# Patient Record
Sex: Male | Born: 1950 | Race: Black or African American | Hispanic: No | Marital: Single | Smoking: Former smoker
Health system: Southern US, Community
[De-identification: ages and names within clinical notes are randomized; demographics above are authoritative.]

## PROBLEM LIST (undated history)

## (undated) DIAGNOSIS — I10 Essential (primary) hypertension: Secondary | ICD-10-CM

## (undated) DIAGNOSIS — I499 Cardiac arrhythmia, unspecified: Secondary | ICD-10-CM

## (undated) DIAGNOSIS — K5904 Chronic idiopathic constipation: Secondary | ICD-10-CM

## (undated) DIAGNOSIS — K219 Gastro-esophageal reflux disease without esophagitis: Secondary | ICD-10-CM

## (undated) DIAGNOSIS — F419 Anxiety disorder, unspecified: Secondary | ICD-10-CM

## (undated) DIAGNOSIS — E782 Mixed hyperlipidemia: Secondary | ICD-10-CM

## (undated) DIAGNOSIS — I700 Atherosclerosis of aorta: Secondary | ICD-10-CM

## (undated) DIAGNOSIS — N183 Chronic kidney disease, stage 3 unspecified: Secondary | ICD-10-CM

## (undated) DIAGNOSIS — Z87442 Personal history of urinary calculi: Secondary | ICD-10-CM

## (undated) DIAGNOSIS — F411 Generalized anxiety disorder: Secondary | ICD-10-CM

## (undated) DIAGNOSIS — D3A020 Benign carcinoid tumor of the appendix: Secondary | ICD-10-CM

## (undated) DIAGNOSIS — N401 Enlarged prostate with lower urinary tract symptoms: Secondary | ICD-10-CM

## (undated) DIAGNOSIS — K449 Diaphragmatic hernia without obstruction or gangrene: Secondary | ICD-10-CM

## (undated) DIAGNOSIS — E7800 Pure hypercholesterolemia, unspecified: Secondary | ICD-10-CM

## (undated) DIAGNOSIS — Z8679 Personal history of other diseases of the circulatory system: Secondary | ICD-10-CM

## (undated) HISTORY — DX: Mixed hyperlipidemia: E78.2

## (undated) HISTORY — DX: Essential (primary) hypertension: I10

## (undated) HISTORY — DX: Anxiety disorder, unspecified: F41.9

## (undated) HISTORY — DX: Benign carcinoid tumor of the appendix: D3A.020

## (undated) HISTORY — DX: Gastro-esophageal reflux disease without esophagitis: K21.9

---

## 2001-10-25 ENCOUNTER — Encounter: Payer: Self-pay | Admitting: Emergency Medicine

## 2001-10-25 ENCOUNTER — Emergency Department (HOSPITAL_COMMUNITY): Admission: EM | Admit: 2001-10-25 | Discharge: 2001-10-25 | Payer: Self-pay | Admitting: Emergency Medicine

## 2001-10-25 ENCOUNTER — Ambulatory Visit (HOSPITAL_COMMUNITY): Admission: RE | Admit: 2001-10-25 | Discharge: 2001-10-25 | Payer: Self-pay | Admitting: Emergency Medicine

## 2002-06-10 ENCOUNTER — Ambulatory Visit (HOSPITAL_COMMUNITY): Admission: RE | Admit: 2002-06-10 | Discharge: 2002-06-10 | Payer: Self-pay | Admitting: Internal Medicine

## 2002-06-10 ENCOUNTER — Encounter: Payer: Self-pay | Admitting: Internal Medicine

## 2003-04-01 ENCOUNTER — Ambulatory Visit (HOSPITAL_COMMUNITY): Admission: RE | Admit: 2003-04-01 | Discharge: 2003-04-01 | Payer: Self-pay | Admitting: Internal Medicine

## 2003-09-08 ENCOUNTER — Ambulatory Visit (HOSPITAL_COMMUNITY): Admission: RE | Admit: 2003-09-08 | Discharge: 2003-09-08 | Payer: Self-pay | Admitting: Internal Medicine

## 2006-06-08 ENCOUNTER — Ambulatory Visit (HOSPITAL_COMMUNITY): Admission: RE | Admit: 2006-06-08 | Discharge: 2006-06-08 | Payer: Self-pay | Admitting: Internal Medicine

## 2008-07-21 ENCOUNTER — Ambulatory Visit (HOSPITAL_COMMUNITY): Admission: RE | Admit: 2008-07-21 | Discharge: 2008-07-21 | Payer: Self-pay | Admitting: Family Medicine

## 2008-07-30 ENCOUNTER — Encounter (INDEPENDENT_AMBULATORY_CARE_PROVIDER_SITE_OTHER): Payer: Self-pay | Admitting: *Deleted

## 2008-08-04 ENCOUNTER — Ambulatory Visit (HOSPITAL_COMMUNITY): Admission: RE | Admit: 2008-08-04 | Discharge: 2008-08-04 | Payer: Self-pay | Admitting: Family Medicine

## 2008-09-22 ENCOUNTER — Encounter (INDEPENDENT_AMBULATORY_CARE_PROVIDER_SITE_OTHER): Payer: Self-pay | Admitting: *Deleted

## 2009-05-22 DIAGNOSIS — I2510 Atherosclerotic heart disease of native coronary artery without angina pectoris: Secondary | ICD-10-CM

## 2009-05-22 HISTORY — DX: Atherosclerotic heart disease of native coronary artery without angina pectoris: I25.10

## 2009-05-22 HISTORY — PX: CARDIAC CATHETERIZATION: SHX172

## 2009-06-11 ENCOUNTER — Ambulatory Visit (HOSPITAL_COMMUNITY): Admission: RE | Admit: 2009-06-11 | Discharge: 2009-06-11 | Payer: Self-pay | Admitting: Family Medicine

## 2010-01-10 ENCOUNTER — Ambulatory Visit (HOSPITAL_COMMUNITY): Admission: RE | Admit: 2010-01-10 | Discharge: 2010-01-10 | Payer: Self-pay | Admitting: Cardiology

## 2010-01-14 ENCOUNTER — Ambulatory Visit (HOSPITAL_COMMUNITY): Admission: RE | Admit: 2010-01-14 | Discharge: 2010-01-15 | Payer: Self-pay | Admitting: Cardiology

## 2010-01-14 HISTORY — PX: CARDIAC CATHETERIZATION: SHX172

## 2010-06-11 ENCOUNTER — Encounter: Payer: Self-pay | Admitting: Internal Medicine

## 2010-08-05 LAB — CARDIAC PANEL(CRET KIN+CKTOT+MB+TROPI)
CK, MB: 2.2 ng/mL (ref 0.3–4.0)
Relative Index: 0.7 (ref 0.0–2.5)
Total CK: 294 U/L — ABNORMAL HIGH (ref 7–232)
Troponin I: 0.01 ng/mL (ref 0.00–0.06)

## 2010-08-05 LAB — BASIC METABOLIC PANEL
BUN: 10 mg/dL (ref 6–23)
BUN: 10 mg/dL (ref 6–23)
CO2: 26 mEq/L (ref 19–32)
CO2: 26 mEq/L (ref 19–32)
Calcium: 8.9 mg/dL (ref 8.4–10.5)
Calcium: 9.2 mg/dL (ref 8.4–10.5)
Chloride: 110 mEq/L (ref 96–112)
Chloride: 110 mEq/L (ref 96–112)
Creatinine, Ser: 0.95 mg/dL (ref 0.4–1.5)
Creatinine, Ser: 0.97 mg/dL (ref 0.4–1.5)
GFR calc Af Amer: >60 mL/min (ref >60)
GFR calc Af Amer: >60 mL/min (ref >60)
GFR calc non Af Amer: >60 mL/min (ref >60)
GFR calc non Af Amer: >60 mL/min (ref >60)
Glucose, Bld: 80 mg/dL (ref 70–99)
Glucose, Bld: 91 mg/dL (ref 70–99)
Potassium: 3.8 mEq/L (ref 3.5–5.1)
Potassium: 4.3 mEq/L (ref 3.5–5.1)
Sodium: 141 mEq/L (ref 135–145)
Sodium: 141 mEq/L (ref 135–145)

## 2010-08-05 LAB — TROPONIN I: Troponin I: 0.01 ng/mL (ref 0.00–0.06)

## 2010-08-05 LAB — CBC
HCT: 39.3 % (ref 39.0–52.0)
HCT: 40.5 % (ref 39.0–52.0)
Hemoglobin: 13 g/dL (ref 13.0–17.0)
Hemoglobin: 13.9 g/dL (ref 13.0–17.0)
MCH: 28.3 pg (ref 26.0–34.0)
MCH: 28.7 pg (ref 26.0–34.0)
MCHC: 33.1 g/dL (ref 30.0–36.0)
MCHC: 34.3 g/dL (ref 30.0–36.0)
MCV: 83.7 fL (ref 78.0–100.0)
MCV: 85.6 fL (ref 78.0–100.0)
Platelets: 149 10*3/uL — ABNORMAL LOW (ref 150–400)
Platelets: 149 10*3/uL — ABNORMAL LOW (ref 150–400)
RBC: 4.59 MIL/uL (ref 4.22–5.81)
RBC: 4.84 MIL/uL (ref 4.22–5.81)
RDW: 13.8 % (ref 11.5–15.5)
RDW: 14.2 % (ref 11.5–15.5)
WBC: 5.7 10*3/uL (ref 4.0–10.5)
WBC: 7.2 10*3/uL (ref 4.0–10.5)

## 2010-08-05 LAB — MRSA PCR SCREENING: MRSA by PCR: NEGATIVE

## 2011-09-19 ENCOUNTER — Other Ambulatory Visit (HOSPITAL_COMMUNITY): Payer: Self-pay | Admitting: Internal Medicine

## 2011-09-19 ENCOUNTER — Other Ambulatory Visit: Payer: Self-pay

## 2011-09-19 ENCOUNTER — Telehealth: Payer: Self-pay

## 2011-09-19 DIAGNOSIS — Z139 Encounter for screening, unspecified: Secondary | ICD-10-CM

## 2011-09-19 DIAGNOSIS — R51 Headache: Secondary | ICD-10-CM

## 2011-09-19 NOTE — Telephone Encounter (Signed)
OK to proceed with colonoscopy.

## 2011-09-19 NOTE — Telephone Encounter (Signed)
Gastroenterology Pre-Procedure Form    Request Date: 09/19/2011      Requesting Physician: Dr. Sherwood Gambler     PATIENT INFORMATION:  Leonard Stark is a 61 y.o., male (DOB=11-15-1950).  PROCEDURE: Procedure(s) requested: colonoscopy Procedure Reason: screening for colon cancer  PATIENT REVIEW QUESTIONS: The patient reports the following:   1. Diabetes Melitis: no 2. Joint replacements in the past 12 months: no 3. Major health problems in the past 3 months: no 4. Has an artificial valve or MVP:no 5. Has been advised in past to take antibiotics in advance of a procedure like teeth cleaning: no}    MEDICATIONS & ALLERGIES:    Patient reports the following regarding taking any blood thinners:   Plavix? no Aspirin?yes  Coumadin?  no  Patient confirms/reports the following medications:  Current Outpatient Prescriptions  Medication Sig Dispense Refill  . nebivolol (BYSTOLIC) 5 MG tablet Take 5 mg by mouth daily.      . pravastatin (PRAVACHOL) 10 MG tablet Take 20 mg by mouth daily.      . valsartan-hydrochlorothiazide (DIOVAN-HCT) 320-25 MG per tablet Take 1 tablet by mouth daily.      Marland Kitchen azithromycin (ZITHROMAX) 250 MG tablet Take 250 mg by mouth daily. Pt has not started yet  ( it was for his allergies/ sinus)      . ranitidine (ZANTAC) 150 MG capsule Take 150 mg by mouth 1 day or 1 dose. To take one daily prn  ( has not picked up yet)        Patient confirms/reports the following allergies:  No Known Allergies  Patient is appropriate to schedule for requested procedure(s): yes  AUTHORIZATION INFORMATION Primary Insurance:   ID #:  Group #:  Pre-Cert / Auth required Pre-Cert / Auth #:   Secondary Insurance:   ID #:   Group #:  Pre-Cert / Auth required: Pre-Cert / Auth #:   No orders of the defined types were placed in this encounter.    SCHEDULE INFORMATION: Procedure has been scheduled as follows:  Date: 10/13/2011        Time:  10:00 AM Location:[facility]  This Gastroenterology Pre-Precedure Form is being routed to the following provider(s) for review: R. Roetta Sessions, MD

## 2011-09-20 MED ORDER — PEG 3350-KCL-NA BICARB-NACL 420 G PO SOLR: ORAL | Status: AC

## 2011-09-20 NOTE — Telephone Encounter (Signed)
Rx and instructions mailed to pt.  

## 2011-09-22 ENCOUNTER — Other Ambulatory Visit (HOSPITAL_COMMUNITY): Payer: Self-pay

## 2011-10-12 MED ORDER — SODIUM CHLORIDE 0.45 % IV SOLN
Freq: Once | INTRAVENOUS | Status: AC
  Administered 2011-10-13: 1000 mL via INTRAVENOUS

## 2011-10-13 ENCOUNTER — Ambulatory Visit (HOSPITAL_COMMUNITY)
Admission: RE | Admit: 2011-10-13 | Discharge: 2011-10-13 | Disposition: A | Discharge status: Home / Self Care | Payer: Managed Care, Other (non HMO) | Source: Ambulatory Visit | Attending: Internal Medicine | Admitting: Internal Medicine

## 2011-10-13 ENCOUNTER — Encounter (HOSPITAL_COMMUNITY)
Admission: RE | Disposition: A | Discharge status: Home / Self Care | Payer: Self-pay | Source: Ambulatory Visit | Attending: Internal Medicine

## 2011-10-13 ENCOUNTER — Encounter (HOSPITAL_COMMUNITY): Payer: Self-pay | Admitting: *Deleted

## 2011-10-13 DIAGNOSIS — Z1211 Encounter for screening for malignant neoplasm of colon: Secondary | ICD-10-CM | POA: Insufficient documentation

## 2011-10-13 DIAGNOSIS — D128 Benign neoplasm of rectum: Secondary | ICD-10-CM | POA: Insufficient documentation

## 2011-10-13 DIAGNOSIS — Z139 Encounter for screening, unspecified: Secondary | ICD-10-CM

## 2011-10-13 DIAGNOSIS — K620 Anal polyp: Secondary | ICD-10-CM

## 2011-10-13 DIAGNOSIS — Z79899 Other long term (current) drug therapy: Secondary | ICD-10-CM | POA: Insufficient documentation

## 2011-10-13 DIAGNOSIS — I10 Essential (primary) hypertension: Secondary | ICD-10-CM | POA: Insufficient documentation

## 2011-10-13 DIAGNOSIS — K621 Rectal polyp: Secondary | ICD-10-CM

## 2011-10-13 HISTORY — DX: Essential (primary) hypertension: I10

## 2011-10-13 HISTORY — PX: COLONOSCOPY: SHX5424

## 2011-10-13 SURGERY — COLONOSCOPY
Anesthesia: Moderate Sedation

## 2011-10-13 MED ORDER — MIDAZOLAM HCL 5 MG/5ML IJ SOLN
INTRAMUSCULAR | Status: AC
  Filled 2011-10-13: qty 10

## 2011-10-13 MED ORDER — STERILE WATER FOR IRRIGATION IR SOLN
Status: DC | PRN
  Administered 2011-10-13: 10:00:00

## 2011-10-13 MED ORDER — MEPERIDINE HCL 100 MG/ML IJ SOLN
INTRAMUSCULAR | Status: AC
  Filled 2011-10-13: qty 1

## 2011-10-13 MED ORDER — MIDAZOLAM HCL 5 MG/5ML IJ SOLN
INTRAMUSCULAR | Status: DC | PRN
  Administered 2011-10-13: 2 mg via INTRAVENOUS
  Administered 2011-10-13 (×2): 1 mg via INTRAVENOUS

## 2011-10-13 MED ORDER — MEPERIDINE HCL 100 MG/ML IJ SOLN
INTRAMUSCULAR | Status: DC | PRN
  Administered 2011-10-13: 50 mg via INTRAVENOUS
  Administered 2011-10-13: 25 mg via INTRAVENOUS

## 2011-10-13 NOTE — H&P (Signed)
  Primary Care Physician:  Cassell Smiles., MD, MD Primary Gastroenterologist:  Dr. Jena Gauss  Pre-Procedure History & Physical: HPI:  Leonard Stark is a 61 y.o. male is here for a screening colonoscopy. No prior colonoscopy. No bowel symptoms. No family history of colon cancer or colon polyps.  Past Medical History  Diagnosis Date  . Hypertension     Past Surgical History  Procedure Date  . Cardiac catheterization 2011    Prior to Admission medications   Medication Sig Start Date End Date Taking? Authorizing Provider  azithromycin (ZITHROMAX) 250 MG tablet Take 250 mg by mouth daily. Pt has not started yet  ( it was for his allergies/ sinus)   Yes Historical Provider, MD  nebivolol (BYSTOLIC) 5 MG tablet Take 5 mg by mouth daily.   Yes Historical Provider, MD  pravastatin (PRAVACHOL) 10 MG tablet Take 20 mg by mouth daily.   Yes Historical Provider, MD  ranitidine (ZANTAC) 150 MG capsule Take 150 mg by mouth 1 day or 1 dose. To take one daily prn  ( has not picked up yet)   Yes Historical Provider, MD  valsartan-hydrochlorothiazide (DIOVAN-HCT) 320-25 MG per tablet Take 1 tablet by mouth daily.   Yes Historical Provider, MD    Allergies as of 09/19/2011  . (No Known Allergies)    History reviewed. No pertinent family history.  History   Social History  . Marital Status: Divorced    Spouse Name: N/A    Number of Children: N/A  . Years of Education: N/A   Occupational History  . Not on file.   Social History Main Topics  . Smoking status: Never Smoker   . Smokeless tobacco: Not on file  . Alcohol Use: 0.5 oz/week    1 drink(s) per week  . Drug Use: No  . Sexually Active: Yes   Other Topics Concern  . Not on file   Social History Narrative  . No narrative on file    Review of Systems: See HPI, otherwise negative ROS  Physical Exam: BP 127/77  Pulse 76  Temp(Src) 97 F (36.1 C) (Oral)  Resp 22  Ht 6\' 1"  (1.854 m)  Wt 210 lb (95.255 kg)  BMI 27.71 kg/m2   SpO2 100% General:   Alert,  Well-developed, well-nourished, pleasant and cooperative in NAD Head:  Normocephalic and atraumatic. Eyes:  Sclera clear, no icterus.   Conjunctiva pink. Ears:  Normal auditory acuity. Nose:  No deformity, discharge,  or lesions. Mouth:  No deformity or lesions, dentition normal. Neck:  Supple; no masses or thyromegaly. Lungs:  Clear throughout to auscultation.   No wheezes, crackles, or rhonchi. No acute distress. Heart:  Regular rate and rhythm; no murmurs, clicks, rubs,  or gallops. Abdomen:  Soft, nontender and nondistended. No masses, hepatosplenomegaly or hernias noted. Normal bowel sounds, without guarding, and without rebound.   Msk:  Symmetrical without gross deformities. Normal posture. Pulses:  Normal pulses noted. Extremities:  Without clubbing or edema. Neurologic:  Alert and  oriented x4;  grossly normal neurologically. Skin:  Intact without significant lesions or rashes. Cervical Nodes:  No significant cervical adenopathy. Psych:  Alert and cooperative. Normal mood and affect.    Impression:   61 year old gentleman here for his first ever average risk screening colonoscopy.  Risks, benefits, limitations, imponderables and alternatives regarding colonoscopy have been reviewed with the patient. Questions have been answered. All parties agreeable.

## 2011-10-13 NOTE — Op Note (Signed)
[facility] [address] [place], [place]  [address]  COLONOSCOPY PROCEDURE REPORT  PATIENT:  Leonard Stark, Leonard Stark  MR#:  [id number] BIRTHDATE:  1950-11-23, 60 yrs. old  GENDER:  male ENDOSCOPIST:  R. Roetta Sessions, MD FACP [facility] REF. BY:  Artis Delay, M.D. PROCEDURE DATE:  10/13/2011 PROCEDURE:  Colonoscopy with biopsy INDICATIONS:  First ever average risk screening colonoscopy  INFORMED CONSENT:  The risks, benefits, alternatives and imponderables including but not limited to bleeding, perforation as well as the possibility of a missed lesion have been reviewed. The potential for biopsy, lesion removal, etc. have also been discussed.  Questions have been answered.  All parties agreeable. Please see the history and physical in the medical record for more information.  MEDICATIONS:  Versed 4 mg IV and Demerol 75 mg IV in divided doses.  DESCRIPTION OF PROCEDURE:  After a digital rectal exam was performed, the EC-[id number] ([id number]) colonoscope was advanced from the anus through the rectum and colon to the area of the cecum, ileocecal valve and appendiceal orifice.  The cecum was deeply intubated.  These structures were well-seen and photographed for the record.  From the level of the cecum and ileocecal valve, the scope was slowly and cautiously withdrawn.  The mucosal surfaces were carefully surveyed utilizing scope tip deflection to facilitate fold flattening as needed.  The scope was pulled down into the rectum where a thorough examination including retroflexion was performed. <<PROCEDUREIMAGES>>  FINDINGS: Poor preparation with vegetable matter scattered throughout the entire colon which could not be done away with. This compromised the  examination. Normal rectum aside from 2 tiny diminutive polypoid areas at 10 cm in from the anal verge. Somewhat redundant but otherwise grossly normal colonic mucosa.  THERAPEUTIC / DIAGNOSTIC MANEUVERS PERFORMED:   The 2   diminutive polypoid areas in the rectum were biopsied/removed with cold biopsy forcep technique.  COMPLICATIONS:  None  CECAL WITHDRAWAL TIME: 12 minutes  IMPRESSION: 2 diminutive polyps in the rectum-removed as described above. Grossly normal colon-however poor prep compromised examination.  RECOMMENDATIONS:   Followup on pathology.  ______________________________ R. Roetta Sessions, MD Caleen Essex  CC:  Artis Delay, M.D.  n. eSIGNED:   R. Roetta Sessions at 10/13/2011 11:03 AM  Suzzanne Cloud, [phone]

## 2011-10-13 NOTE — Discharge Instructions (Addendum)
Colonoscopy Discharge Instructions  Read the instructions outlined below and refer to this sheet in the next few weeks. These discharge instructions provide you with general information on caring for yourself after you leave the hospital. Your doctor may also give you specific instructions. While your treatment has been planned according to the most current medical practices available, unavoidable complications occasionally occur. If you have any problems or questions after discharge, call Dr. Rourk at [phone]. ACTIVITY  You may resume your regular activity, but move at a slower pace for the next 24 hours.   Take frequent rest periods for the next 24 hours.   Walking will help get rid of the air and reduce the bloated feeling in your belly (abdomen).   No driving for 24 hours (because of the medicine (anesthesia) used during the test).    Do not sign any important legal documents or operate any machinery for 24 hours (because of the anesthesia used during the test).  NUTRITION  Drink plenty of fluids.   You may resume your normal diet as instructed by your doctor.   Begin with a light meal and progress to your normal diet. Heavy or fried foods are harder to digest and may make you feel sick to your stomach (nauseated).   Avoid alcoholic beverages for 24 hours or as instructed.  MEDICATIONS  You may resume your normal medications unless your doctor tells you otherwise.  WHAT YOU CAN EXPECT TODAY  Some feelings of bloating in the abdomen.   Passage of more gas than usual.   Spotting of blood in your stool or on the toilet paper.  IF YOU HAD POLYPS REMOVED DURING THE COLONOSCOPY:  No aspirin products for 7 days or as instructed.   No alcohol for 7 days or as instructed.   Eat a soft diet for the next 24 hours.  FINDING OUT THE RESULTS OF YOUR TEST Not all test results are available during your visit. If your test results are not back during the visit, make an appointment  with your caregiver to find out the results. Do not assume everything is normal if you have not heard from your caregiver or the medical facility. It is important for you to follow up on all of your test results.  SEEK IMMEDIATE MEDICAL ATTENTION IF:  You have more than a spotting of blood in your stool.   Your belly is swollen (abdominal distention).   You are nauseated or vomiting.   You have a temperature over 101.   You have abdominal pain or discomfort that is severe or gets worse throughout the day.    Polyp information provided.  Further recommendations to follow pending review of pathology report.  Colon Polyps A polyp is extra tissue that grows inside your body. Colon polyps grow in the large intestine. The large intestine, also called the colon, is part of your digestive system. It is a long, hollow tube at the end of your digestive tract where your body makes and stores stool. Most polyps are not dangerous. They are benign. This means they are not cancerous. But over time, some types of polyps can turn into cancer. Polyps that are smaller than a pea are usually not harmful. But larger polyps could someday become or may already be cancerous. To be safe, doctors remove all polyps and test them.  WHO GETS POLYPS? Anyone can get polyps, but certain people are more likely than others. You may have a greater chance of getting polyps if:    You are over 50.   You have had polyps before.   Someone in your family has had polyps.   Someone in your family has had cancer of the large intestine.   Find out if someone in your family has had polyps. You may also be more likely to get polyps if you:   Eat a lot of fatty foods.   Smoke.   Drink alcohol.   Do not exercise.   Eat too much.  SYMPTOMS  Most small polyps do not cause symptoms. People often do not know they have one until their caregiver finds it during a regular checkup or while testing them for something else. Some  people do have symptoms like these:  Bleeding from the anus. You might notice blood on your underwear or on toilet paper after you have had a bowel movement.   Constipation or diarrhea that lasts more than a week.   Blood in the stool. Blood can make stool look black or it can show up as red streaks in the stool.  If you have any of these symptoms, see your caregiver. HOW DOES THE DOCTOR TEST FOR POLYPS? The doctor can use four tests to check for polyps:  Digital rectal exam. The caregiver wears gloves and checks your rectum (the last part of the large intestine) to see if it feels normal. This test would find polyps only in the rectum. Your caregiver may need to do one of the other tests listed below to find polyps higher up in the intestine.   Barium enema. The caregiver puts a liquid called barium into your rectum before taking x-rays of your large intestine. Barium makes your intestine look white in the pictures. Polyps are dark, so they are easy to see.   Sigmoidoscopy. With this test, the caregiver can see inside your large intestine. A thin flexible tube is placed into your rectum. The device is called a sigmoidoscope, which has a light and a tiny video camera in it. The caregiver uses the sigmoidoscope to look at the last third of your large intestine.   Colonoscopy. This test is like sigmoidoscopy, but the caregiver looks at all of the large intestine. It usually requires sedation. This is the most common method for finding and removing polyps.  TREATMENT   The caregiver will remove the polyp during sigmoidoscopy or colonoscopy. The polyp is then tested for cancer.   If you have had polyps, your caregiver may want you to get tested regularly in the future.  PREVENTION  There is not one sure way to prevent polyps. You might be able to lower your risk of getting them if you:  Eat more fruits and vegetables and less fatty food.   Do not smoke.   Avoid alcohol.   Exercise every  day.   Lose weight if you are overweight.   Eating more calcium and folate can also lower your risk of getting polyps. Some foods that are rich in calcium are milk, cheese, and broccoli. Some foods that are rich in folate are chickpeas, kidney beans, and spinach.   Aspirin might help prevent polyps. Studies are under way.  Document Released: 02/02/2004 Document Revised: 04/27/2011 Document Reviewed: 07/10/2007 [employer] Patient Information 2012 [employer]. 

## 2011-10-18 ENCOUNTER — Encounter (HOSPITAL_COMMUNITY): Payer: Self-pay | Admitting: Internal Medicine

## 2011-10-21 ENCOUNTER — Encounter: Payer: Self-pay | Admitting: Internal Medicine

## 2012-07-09 ENCOUNTER — Ambulatory Visit (HOSPITAL_COMMUNITY)
Admission: RE | Admit: 2012-07-09 | Discharge: 2012-07-09 | Disposition: A | Discharge status: Home / Self Care | Payer: Managed Care, Other (non HMO) | Source: Ambulatory Visit | Attending: Family Medicine | Admitting: Family Medicine

## 2012-07-09 ENCOUNTER — Emergency Department (HOSPITAL_COMMUNITY): Payer: Managed Care, Other (non HMO)

## 2012-07-09 ENCOUNTER — Encounter (HOSPITAL_COMMUNITY): Payer: Self-pay

## 2012-07-09 ENCOUNTER — Other Ambulatory Visit: Payer: Self-pay

## 2012-07-09 ENCOUNTER — Other Ambulatory Visit (HOSPITAL_COMMUNITY): Payer: Self-pay | Admitting: Family Medicine

## 2012-07-09 ENCOUNTER — Inpatient Hospital Stay (HOSPITAL_COMMUNITY)
Admission: EM | Admit: 2012-07-09 | Discharge: 2012-07-12 | DRG: 373 | Disposition: A | Discharge status: Home / Self Care | Payer: Managed Care, Other (non HMO) | Attending: General Surgery | Admitting: General Surgery

## 2012-07-09 DIAGNOSIS — R1011 Right upper quadrant pain: Secondary | ICD-10-CM

## 2012-07-09 DIAGNOSIS — E7800 Pure hypercholesterolemia, unspecified: Secondary | ICD-10-CM | POA: Diagnosis present

## 2012-07-09 DIAGNOSIS — K3580 Unspecified acute appendicitis: Secondary | ICD-10-CM | POA: Insufficient documentation

## 2012-07-09 DIAGNOSIS — R933 Abnormal findings on diagnostic imaging of other parts of digestive tract: Secondary | ICD-10-CM | POA: Insufficient documentation

## 2012-07-09 DIAGNOSIS — K352 Acute appendicitis with generalized peritonitis, without abscess: Principal | ICD-10-CM | POA: Diagnosis present

## 2012-07-09 DIAGNOSIS — I10 Essential (primary) hypertension: Secondary | ICD-10-CM | POA: Diagnosis present

## 2012-07-09 DIAGNOSIS — Z7982 Long term (current) use of aspirin: Secondary | ICD-10-CM

## 2012-07-09 DIAGNOSIS — R109 Unspecified abdominal pain: Secondary | ICD-10-CM | POA: Insufficient documentation

## 2012-07-09 DIAGNOSIS — K3532 Acute appendicitis with perforation and localized peritonitis, without abscess: Secondary | ICD-10-CM

## 2012-07-09 DIAGNOSIS — K35209 Acute appendicitis with generalized peritonitis, without abscess, unspecified as to perforation: Principal | ICD-10-CM | POA: Diagnosis present

## 2012-07-09 HISTORY — DX: Pure hypercholesterolemia, unspecified: E78.00

## 2012-07-09 LAB — CBC WITH DIFFERENTIAL/PLATELET
Eosinophils Absolute: 0 10*3/uL (ref 0.0–0.7)
Eosinophils Relative: 0 % (ref 0–5)
HCT: 42.2 % (ref 39.0–52.0)
Hemoglobin: 14.2 g/dL (ref 13.0–17.0)
Lymphs Abs: 1.8 10*3/uL (ref 0.7–4.0)
MCH: 28.3 pg (ref 26.0–34.0)
MCV: 84.1 fL (ref 78.0–100.0)
Monocytes Absolute: 1.2 10*3/uL — ABNORMAL HIGH (ref 0.1–1.0)
Monocytes Relative: 13 % — ABNORMAL HIGH (ref 3–12)
Neutrophils Relative %: 69 % (ref 43–77)
RBC: 5.02 MIL/uL (ref 4.22–5.81)

## 2012-07-09 LAB — URINE MICROSCOPIC-ADD ON

## 2012-07-09 LAB — COMPREHENSIVE METABOLIC PANEL
ALT: 31 U/L (ref 0–53)
AST: 24 U/L (ref 0–37)
BUN: 14 mg/dL (ref 6–23)
GFR calc non Af Amer: 64 mL/min — ABNORMAL LOW (ref >90)
Glucose, Bld: 93 mg/dL (ref 70–99)
Potassium: 3.2 mEq/L — ABNORMAL LOW (ref 3.5–5.1)
Total Bilirubin: 1.7 mg/dL — ABNORMAL HIGH (ref 0.3–1.2)
Total Protein: 8.4 g/dL — ABNORMAL HIGH (ref 6.0–8.3)

## 2012-07-09 LAB — URINALYSIS, ROUTINE W REFLEX MICROSCOPIC
Leukocytes, UA: NEGATIVE
Nitrite: NEGATIVE
Protein, ur: NEGATIVE mg/dL
Specific Gravity, Urine: <1.005 — ABNORMAL LOW (ref 1.005–1.030)
Urobilinogen, UA: 2 mg/dL — ABNORMAL HIGH (ref 0.0–1.0)

## 2012-07-09 LAB — TROPONIN I: Troponin I: <0.3 ng/mL (ref <0.30)

## 2012-07-09 MED ORDER — HYDROMORPHONE HCL PF 1 MG/ML IJ SOLN
1.0000 mg | INTRAMUSCULAR | Status: DC | PRN
  Administered 2012-07-09: 2 mg via INTRAVENOUS
  Filled 2012-07-09: qty 2

## 2012-07-09 MED ORDER — MORPHINE SULFATE 4 MG/ML IJ SOLN
4.0000 mg | INTRAMUSCULAR | Status: DC | PRN
  Administered 2012-07-09: 4 mg via INTRAVENOUS
  Filled 2012-07-09: qty 1

## 2012-07-09 MED ORDER — ONDANSETRON HCL 4 MG/2ML IJ SOLN
4.0000 mg | INTRAMUSCULAR | Status: DC | PRN
  Administered 2012-07-09: 4 mg via INTRAVENOUS
  Filled 2012-07-09: qty 2

## 2012-07-09 MED ORDER — ONDANSETRON HCL 4 MG/2ML IJ SOLN
4.0000 mg | Freq: Four times a day (QID) | INTRAMUSCULAR | Status: DC | PRN
  Administered 2012-07-10: 4 mg via INTRAVENOUS
  Filled 2012-07-09: qty 2

## 2012-07-09 MED ORDER — ERTAPENEM SODIUM 1 G IJ SOLR
1.0000 g | INTRAMUSCULAR | Status: DC
  Administered 2012-07-10 – 2012-07-11 (×2): 1 g via INTRAVENOUS
  Filled 2012-07-09 (×6): qty 1

## 2012-07-09 MED ORDER — SODIUM CHLORIDE 0.9 % IV SOLN
INTRAVENOUS | Status: DC
  Administered 2012-07-09 – 2012-07-12 (×3): via INTRAVENOUS

## 2012-07-09 MED ORDER — LACTATED RINGERS IV SOLN
INTRAVENOUS | Status: DC
  Administered 2012-07-09 – 2012-07-11 (×4): via INTRAVENOUS

## 2012-07-09 MED ORDER — IOHEXOL 300 MG/ML  SOLN
100.0000 mL | Freq: Once | INTRAMUSCULAR | Status: AC | PRN
  Administered 2012-07-09: 100 mL via INTRAVENOUS

## 2012-07-09 MED ORDER — SODIUM CHLORIDE 0.9 % IV SOLN
500.0000 mg | Freq: Once | INTRAVENOUS | Status: AC
  Administered 2012-07-09: 500 mg via INTRAVENOUS
  Filled 2012-07-09: qty 500

## 2012-07-09 MED ORDER — ENOXAPARIN SODIUM 40 MG/0.4ML ~~LOC~~ SOLN
40.0000 mg | SUBCUTANEOUS | Status: DC
  Administered 2012-07-09: 40 mg via SUBCUTANEOUS
  Filled 2012-07-09: qty 0.4

## 2012-07-09 MED ORDER — ENOXAPARIN SODIUM 40 MG/0.4ML ~~LOC~~ SOLN
40.0000 mg | SUBCUTANEOUS | Status: DC
  Administered 2012-07-10 – 2012-07-11 (×2): 40 mg via SUBCUTANEOUS
  Filled 2012-07-09 (×3): qty 0.4

## 2012-07-09 MED ORDER — SODIUM CHLORIDE 0.9 % IV SOLN: 1.0000 g | INTRAVENOUS | Status: DC

## 2012-07-09 MED ORDER — PANTOPRAZOLE SODIUM 40 MG IV SOLR
40.0000 mg | Freq: Every day | INTRAVENOUS | Status: DC
  Administered 2012-07-09 – 2012-07-11 (×3): 40 mg via INTRAVENOUS
  Filled 2012-07-09 (×3): qty 40

## 2012-07-09 NOTE — ED Notes (Signed)
Pt reports lower abd pain since SUnday.  LBM was Monday.  Denies any n/v.  Went to [place] and saw PA and had CT scan today.  Reports was diagnosed with appendicitis and sent here for further treatment.

## 2012-07-09 NOTE — ED Provider Notes (Signed)
History     CSN: [id number]  Arrival date & time 07/09/12  1421   First MD Initiated Contact with Patient 07/09/12 1424      Chief Complaint  Patient presents with  . Abdominal Pain     HPI Pt was seen at 1430.    Per pt, c/o gradual onset and worsening of persistent generalized abd "pain" for the past 2 days.  Has been associated with home fevers to "101."  Pt was eval by his PMD today, sent for outpatient CT A/P, then sent to the ED for further eval.  Denies vomiting/diarrhea, no back pain, no rash, no CP/SOB, no black or blood in stools or emesis.        Past Medical History  Diagnosis Date  . Hypertension   . Hypercholesterolemia     Past Surgical History  Procedure Laterality Date  . Cardiac catheterization  2011  . Colonoscopy  10/13/2011    Procedure: COLONOSCOPY;  Surgeon: Corbin Ade, MD;  Location: [facility];  Service: Endoscopy;  Laterality: N/A;  10:00 AM    History  Substance Use Topics  . Smoking status: Never Smoker   . Smokeless tobacco: Not on file  . Alcohol Use: No     Review of Systems ROS: Statement: All systems negative except as marked or noted in the HPI; Constitutional: +fever and chills. ; ; Eyes: Negative for eye pain, redness and discharge. ; ; ENMT: Negative for ear pain, hoarseness, nasal congestion, sinus pressure and sore throat. ; ; Cardiovascular: Negative for chest pain, palpitations, diaphoresis, dyspnea and peripheral edema. ; ; Respiratory: Negative for cough, wheezing and stridor. ; ; Gastrointestinal: +abd pain. Negative for nausea, vomiting, diarrhea, blood in stool, hematemesis, jaundice and rectal bleeding. . ; ; Genitourinary: Negative for dysuria, flank pain and hematuria. ; ; Musculoskeletal: Negative for back pain and neck pain. Negative for swelling and trauma.; ; Skin: Negative for pruritus, rash, abrasions, blisters, bruising and skin lesion.; ; Neuro: Negative for headache, lightheadedness and neck stiffness. Negative  for weakness, altered level of consciousness , altered mental status, extremity weakness, paresthesias, involuntary movement, seizure and syncope.       Allergies  Review of patient's allergies indicates no known allergies.  Home Medications   Current Outpatient Rx  Name  Route  Sig  Dispense  Refill  . azithromycin (ZITHROMAX) 250 MG tablet   Oral   Take 250 mg by mouth daily. Pt has not started yet  ( it was for his allergies/ sinus)         . nebivolol (BYSTOLIC) 5 MG tablet   Oral   Take 5 mg by mouth daily.         . pravastatin (PRAVACHOL) 10 MG tablet   Oral   Take 20 mg by mouth daily.         . ranitidine (ZANTAC) 150 MG capsule   Oral   Take 150 mg by mouth 1 day or 1 dose. To take one daily prn  ( has not picked up yet)         . valsartan-hydrochlorothiazide (DIOVAN-HCT) 320-25 MG per tablet   Oral   Take 1 tablet by mouth daily.           BP 127/82  Pulse 95  Temp(Src) 100.4 F (38 C) (Oral)  Resp 18  Ht 6\' 1"  (1.854 m)  Wt 212 lb (96.163 kg)  BMI 27.98 kg/m2  SpO2 98%  Physical Exam 1435: Physical examination:  Nursing notes reviewed; Vital signs and O2 SAT reviewed;  Constitutional: Well developed, Well nourished, Well hydrated, In no acute distress; Head:  Normocephalic, atraumatic; Eyes: EOMI, PERRL, No scleral icterus; ENMT: Mouth and pharynx normal, Mucous membranes moist; Neck: Supple, Full range of motion, No lymphadenopathy; Cardiovascular: Regular rate and rhythm, with occasional ectopy, No gallop; Respiratory: Breath sounds clear & equal bilaterally, No rales, rhonchi, wheezes.  Speaking full sentences with ease, Normal respiratory effort/excursion; Chest: Nontender, Movement normal; Abdomen: Soft, +RLQ tenderness to palp. +rebound, +guarding, +Rovsing's. Nondistended, Decreased bowel sounds; Genitourinary: No CVA tenderness; Extremities: Pulses normal, No tenderness, No edema, No calf edema or asymmetry.; Neuro: AA&Ox3, Major CN grossly  intact.  Speech clear. Climbs on and off stretcher easily by himself. Gait steady. No gross focal motor or sensory deficits in extremities.; Skin: Color normal, Warm, Dry.   ED Course  Procedures   1500:   T/C to General Surgeon Dr. Leticia Penna, case discussed, including:  HPI, pertinent PM/SHx, VS/PE, dx testing, ED course and treatment:  Agreeable to admit, requests he will review the CT scan and come to the ED for eval; aware workup is in progress, abx ordered, kept NPO.   1600:  T/C back from Dr. Leticia Penna, he has viewed pt's CT scan, states pt has perforated, will need abx first, no emergent surgery tonight, he will put in orders to admit.    MDM  MDM Reviewed: nursing note and vitals Reviewed previous: CT scan Interpretation: x-ray, labs and ECG    Date: 07/09/2012  Rate: 98  Rhythm: normal sinus rhythm and premature atrial contractions (PAC)  QRS Axis: normal  Intervals: normal  ST/T Wave abnormalities: nonspecific ST/T changes  Conduction Disutrbances:none  Narrative Interpretation:   Old EKG Reviewed: none available  Ct Abdomen Pelvis W Contrast 07/09/2012  *RADIOLOGY REPORT*  Clinical Data: Abdominal pain  CT ABDOMEN AND PELVIS WITH CONTRAST  Technique:  Multidetector CT imaging of the abdomen and pelvis was performed following the standard protocol during bolus administration of intravenous contrast.  Contrast: OMNIPAQUE IOHEXOL 300 MG/ML  SOLN  Comparison: 01/14/2010  Findings: Acute appendicitis.  The appendix is markedly inflamed with wall thickening.  There is extensive stranding in the surrounding fat.  At the tip of the appendix, there is  air fluid contained sac either representing a dilated appendix or appendiceal rupture with small abscess formation.  This collection is only 13 mm in diameter.  Diffuse hepatic steatosis.  Normal gallbladder per  Spleen, pancreas, adrenal glands are within normal limits.  Stable appearance of the kidneys.  Sub centimeter left interpolar  region angiomyolipoma is stable.  Mild atherosclerotic vascular calcifications.  No free fluid.  No abnormal adenopathy.  Bladder is unremarkable.  Unremarkable prostate.  Seminal vesicles are prominent of unknown significance.  No destructive bone lesion.  IMPRESSION: Acute appendicitis.  Ruptured appendicitis is suspected. Critical Value/emergent results were called by telephone at the time of interpretation on 07/09/2012 at 1345 hours to [facility] PA, who verbally acknowledged these results.   Original Report Authenticated By: Jolaine Click, M.D.    Dg Chest Port 1 View 07/09/2012  *RADIOLOGY REPORT*  Clinical Data: Cough, rule out infiltrate  PORTABLE CHEST - 1 VIEW  Comparison: 01/10/2010  Findings: Cardiomediastinal silhouette is stable. No pulmonary edema.  Linear atelectasis or early infiltrate left base.  The bony thorax is stable.  IMPRESSION: Linear atelectasis or early infiltrate left base.  No pulmonary edema.   Original Report Authenticated By: Natasha Mead, M.D.  Results for orders placed during the hospital encounter of 07/09/12  CBC WITH DIFFERENTIAL      Result Value Range   WBC 9.8  4.0 - 10.5 K/uL   RBC 5.02  4.22 - 5.81 MIL/uL   Hemoglobin 14.2  13.0 - 17.0 g/dL   HCT 16.1  09.6 - 04.5 %   MCV 84.1  78.0 - 100.0 fL   MCH 28.3  26.0 - 34.0 pg   MCHC 33.6  30.0 - 36.0 g/dL   RDW 40.9  81.1 - 91.4 %   Platelets 192  150 - 400 K/uL   Neutrophils Relative 69  43 - 77 %   Neutro Abs 6.7  1.7 - 7.7 K/uL   Lymphocytes Relative 18  12 - 46 %   Lymphs Abs 1.8  0.7 - 4.0 K/uL   Monocytes Relative 13 (*) 3 - 12 %   Monocytes Absolute 1.2 (*) 0.1 - 1.0 K/uL   Eosinophils Relative 0  0 - 5 %   Eosinophils Absolute 0.0  0.0 - 0.7 K/uL   Basophils Relative 0  0 - 1 %   Basophils Absolute 0.0  0.0 - 0.1 K/uL  COMPREHENSIVE METABOLIC PANEL      Result Value Range   Sodium 133 (*) 135 - 145 mEq/L   Potassium 3.2 (*) 3.5 - 5.1 mEq/L   Chloride 93 (*) 96 - 112 mEq/L   CO2 26  19  - 32 mEq/L   Glucose, Bld 93  70 - 99 mg/dL   BUN 14  6 - 23 mg/dL   Creatinine, Ser 7.82  0.50 - 1.35 mg/dL   Calcium 9.7  8.4 - 95.6 mg/dL   Total Protein 8.4 (*) 6.0 - 8.3 g/dL   Albumin 4.5  3.5 - 5.2 g/dL   AST 24  0 - 37 U/L   ALT 31  0 - 53 U/L   Alkaline Phosphatase 60  39 - 117 U/L   Total Bilirubin 1.7 (*) 0.3 - 1.2 mg/dL   GFR calc non Af Amer 64 (*) >90 mL/min   GFR calc Af Amer 74 (*) >90 mL/min  LIPASE, BLOOD      Result Value Range   Lipase 26  11 - 59 U/L  TROPONIN I      Result Value Range   Troponin I <0.30  <0.30 ng/mL          Laray Anger, DO 07/11/12 1337

## 2012-07-10 LAB — CBC
HCT: 36.7 % — ABNORMAL LOW (ref 39.0–52.0)
Hemoglobin: 12.4 g/dL — ABNORMAL LOW (ref 13.0–17.0)
MCV: 84.2 fL (ref 78.0–100.0)
Platelets: 174 10*3/uL (ref 150–400)
RBC: 4.36 MIL/uL (ref 4.22–5.81)
WBC: 11.5 10*3/uL — ABNORMAL HIGH (ref 4.0–10.5)

## 2012-07-10 LAB — BASIC METABOLIC PANEL
CO2: 25 mEq/L (ref 19–32)
Chloride: 98 mEq/L (ref 96–112)
Potassium: 3.1 mEq/L — ABNORMAL LOW (ref 3.5–5.1)
Sodium: 136 mEq/L (ref 135–145)

## 2012-07-10 LAB — URINE CULTURE

## 2012-07-10 MED ORDER — ACETAMINOPHEN 325 MG PO TABS
650.0000 mg | ORAL_TABLET | ORAL | Status: DC | PRN
  Administered 2012-07-10 – 2012-07-11 (×3): 650 mg via ORAL
  Filled 2012-07-10 (×3): qty 2

## 2012-07-10 MED ORDER — PHENOL 1.4 % MT LIQD
1.0000 | OROMUCOSAL | Status: DC | PRN
  Administered 2012-07-10: 1 via OROMUCOSAL
  Filled 2012-07-10: qty 177

## 2012-07-10 MED ORDER — POTASSIUM CHLORIDE 10 MEQ/100ML IV SOLN
10.0000 meq | INTRAVENOUS | Status: AC
  Administered 2012-07-10 (×4): 10 meq via INTRAVENOUS
  Filled 2012-07-10: qty 400

## 2012-07-10 NOTE — H&P (Signed)
Leonard Stark is an 62 y.o. male.   Chief Complaint: Right lower quadrant abdominal pain. HPI: Patient presented to [facility] with right lower quadrant abdominal pain. This had been ongoing since Sunday. Pain onset was revealed Lahey acute on Sunday. It persisted on Monday patient was limited in his activities. He was actually seen by his PCP who ordered an outpatient workup for his abdominal pain including a CT evaluation of the abdomen and pelvis. The findings on the CT were discussed the patient and patient was sent from the radiology department to the emergency room with suspected appendicitis. He has had some associated nausea vomiting. He has had associated fevers and chills. No change in bowel movements. No melena or hematochezia. No sick contacts. No similar symptomatology in the past.  Past Medical History  Diagnosis Date  . Hypertension   . Hypercholesterolemia     Past Surgical History  Procedure Laterality Date  . Cardiac catheterization  2011  . Colonoscopy  10/13/2011    Procedure: COLONOSCOPY;  Surgeon: Corbin Ade, MD;  Location: [facility];  Service: Endoscopy;  Laterality: N/A;  10:00 AM    No family history on file. Social History:  reports that he has never smoked. He does not have any smokeless tobacco history on file. He reports that he does not drink alcohol or use illicit drugs.  Allergies: No Known Allergies  Medications Prior to Admission  Medication Sig Dispense Refill  . aspirin EC 81 MG tablet Take 81 mg by mouth every morning.      Marland Kitchen atenolol (TENORMIN) 50 MG tablet Take 50 mg by mouth every morning.      . nebivolol (BYSTOLIC) 5 MG tablet Take 5 mg by mouth every morning.       . pravastatin (PRAVACHOL) 20 MG tablet Take 20 mg by mouth every morning.        Results for orders placed during the hospital encounter of 07/09/12 (from the past 48 hour(s))  CBC WITH DIFFERENTIAL     Status: Abnormal   Collection Time    07/09/12  2:45 PM   Result Value Range   WBC 9.8  4.0 - 10.5 K/uL   RBC 5.02  4.22 - 5.81 MIL/uL   Hemoglobin 14.2  13.0 - 17.0 g/dL   HCT 29.5  62.1 - 30.8 %   MCV 84.1  78.0 - 100.0 fL   MCH 28.3  26.0 - 34.0 pg   MCHC 33.6  30.0 - 36.0 g/dL   RDW 65.7  84.6 - 96.2 %   Platelets 192  150 - 400 K/uL   Neutrophils Relative 69  43 - 77 %   Neutro Abs 6.7  1.7 - 7.7 K/uL   Lymphocytes Relative 18  12 - 46 %   Lymphs Abs 1.8  0.7 - 4.0 K/uL   Monocytes Relative 13 (*) 3 - 12 %   Monocytes Absolute 1.2 (*) 0.1 - 1.0 K/uL   Eosinophils Relative 0  0 - 5 %   Eosinophils Absolute 0.0  0.0 - 0.7 K/uL   Basophils Relative 0  0 - 1 %   Basophils Absolute 0.0  0.0 - 0.1 K/uL  COMPREHENSIVE METABOLIC PANEL     Status: Abnormal   Collection Time    07/09/12  2:45 PM      Result Value Range   Sodium 133 (*) 135 - 145 mEq/L   Potassium 3.2 (*) 3.5 - 5.1 mEq/L   Chloride 93 (*)  96 - 112 mEq/L   CO2 26  19 - 32 mEq/L   Glucose, Bld 93  70 - 99 mg/dL   BUN 14  6 - 23 mg/dL   Creatinine, Ser 1.47  0.50 - 1.35 mg/dL   Calcium 9.7  8.4 - 82.9 mg/dL   Total Protein 8.4 (*) 6.0 - 8.3 g/dL   Albumin 4.5  3.5 - 5.2 g/dL   AST 24  0 - 37 U/L   ALT 31  0 - 53 U/L   Alkaline Phosphatase 60  39 - 117 U/L   Total Bilirubin 1.7 (*) 0.3 - 1.2 mg/dL   GFR calc non Af Amer 64 (*) >90 mL/min   GFR calc Af Amer 74 (*) >90 mL/min   Comment:            The eGFR has been calculated     using the CKD EPI equation.     This calculation has not been     validated in all clinical     situations.     eGFR's persistently     <90 mL/min signify     possible Chronic Kidney Disease.  LIPASE, BLOOD     Status: None   Collection Time    07/09/12  2:45 PM      Result Value Range   Lipase 26  11 - 59 U/L  TROPONIN I     Status: None   Collection Time    07/09/12  2:45 PM      Result Value Range   Troponin I <0.30  <0.30 ng/mL   Comment:            Due to the release kinetics of cTnI,     a negative result within the first  hours     of the onset of symptoms does not rule out     myocardial infarction with certainty.     If myocardial infarction is still suspected,     repeat the test at appropriate intervals.  URINALYSIS, ROUTINE W REFLEX MICROSCOPIC     Status: Abnormal   Collection Time    07/09/12  3:31 PM      Result Value Range   Color, Urine YELLOW  YELLOW   APPearance CLEAR  CLEAR   Specific Gravity, Urine <1.005 (*) 1.005 - 1.030   pH 6.0  5.0 - 8.0   Glucose, UA NEGATIVE  NEGATIVE mg/dL   Hgb urine dipstick MODERATE (*) NEGATIVE   Bilirubin Urine NEGATIVE  NEGATIVE   Ketones, ur NEGATIVE  NEGATIVE mg/dL   Protein, ur NEGATIVE  NEGATIVE mg/dL   Urobilinogen, UA 2.0 (*) 0.0 - 1.0 mg/dL   Nitrite NEGATIVE  NEGATIVE   Leukocytes, UA NEGATIVE  NEGATIVE  URINE MICROSCOPIC-ADD ON     Status: None   Collection Time    07/09/12  3:31 PM      Result Value Range   RBC / HPF 3-6  <3 RBC/hpf   Bacteria, UA RARE  RARE  BASIC METABOLIC PANEL     Status: Abnormal   Collection Time    07/10/12  4:55 AM      Result Value Range   Sodium 136  135 - 145 mEq/L   Potassium 3.1 (*) 3.5 - 5.1 mEq/L   Chloride 98  96 - 112 mEq/L   CO2 25  19 - 32 mEq/L   Glucose, Bld 127 (*) 70 - 99 mg/dL   BUN 20  6 - 23 mg/dL  Creatinine, Ser 1.32  0.50 - 1.35 mg/dL   Calcium 9.0  8.4 - 16.1 mg/dL   GFR calc non Af Amer 57 (*) >90 mL/min   GFR calc Af Amer 66 (*) >90 mL/min   Comment:            The eGFR has been calculated     using the CKD EPI equation.     This calculation has not been     validated in all clinical     situations.     eGFR's persistently     <90 mL/min signify     possible Chronic Kidney Disease.  CBC     Status: Abnormal   Collection Time    07/10/12  4:55 AM      Result Value Range   WBC 11.5 (*) 4.0 - 10.5 K/uL   RBC 4.36  4.22 - 5.81 MIL/uL   Hemoglobin 12.4 (*) 13.0 - 17.0 g/dL   HCT 09.6 (*) 04.5 - 40.9 %   MCV 84.2  78.0 - 100.0 fL   MCH 28.4  26.0 - 34.0 pg   MCHC 33.8  30.0 -  36.0 g/dL   RDW 81.1  91.4 - 78.2 %   Platelets 174  150 - 400 K/uL   Ct Abdomen Pelvis W Contrast  07/09/2012  *RADIOLOGY REPORT*  Clinical Data: Abdominal pain  CT ABDOMEN AND PELVIS WITH CONTRAST  Technique:  Multidetector CT imaging of the abdomen and pelvis was performed following the standard protocol during bolus administration of intravenous contrast.  Contrast: OMNIPAQUE IOHEXOL 300 MG/ML  SOLN  Comparison: 01/14/2010  Findings: Acute appendicitis.  The appendix is markedly inflamed with wall thickening.  There is extensive stranding in the surrounding fat.  At the tip of the appendix, there is  air fluid contained sac either representing a dilated appendix or appendiceal rupture with small abscess formation.  This collection is only 13 mm in diameter.  Diffuse hepatic steatosis.  Normal gallbladder per  Spleen, pancreas, adrenal glands are within normal limits.  Stable appearance of the kidneys.  Sub centimeter left interpolar region angiomyolipoma is stable.  Mild atherosclerotic vascular calcifications.  No free fluid.  No abnormal adenopathy.  Bladder is unremarkable.  Unremarkable prostate.  Seminal vesicles are prominent of unknown significance.  No destructive bone lesion.  IMPRESSION: Acute appendicitis.  Ruptured appendicitis is suspected. Critical Value/emergent results were called by telephone at the time of interpretation on 07/09/2012 at 1345 hours to [facility] PA, who verbally acknowledged these results.   Original Report Authenticated By: Jolaine Click, M.D.    Dg Chest Port 1 View  07/09/2012  *RADIOLOGY REPORT*  Clinical Data: Cough, rule out infiltrate  PORTABLE CHEST - 1 VIEW  Comparison: 01/10/2010  Findings: Cardiomediastinal silhouette is stable. No pulmonary edema.  Linear atelectasis or early infiltrate left base.  The bony thorax is stable.  IMPRESSION: Linear atelectasis or early infiltrate left base.  No pulmonary edema.   Original Report Authenticated By: Natasha Mead, M.D.     Review of Systems  Constitutional: Positive for fever, chills and malaise/fatigue.  HENT: Negative.   Eyes: Negative.   Respiratory: Negative.   Cardiovascular: Negative.   Gastrointestinal: Positive for heartburn, nausea, vomiting and abdominal pain (right lower quadrant). Negative for diarrhea, constipation, blood in stool and melena.  Genitourinary: Negative.   Musculoskeletal: Negative.   Skin: Negative.   Neurological: Negative.   Endo/Heme/Allergies: Negative.   Psychiatric/Behavioral: Negative.     Blood pressure  100/63, pulse 93, temperature 98.3 F (36.8 C), temperature source Oral, resp. rate 17, height 6\' 1"  (1.854 m), weight 94.1 kg (207 lb 7.3 oz), SpO2 93.00%. Physical Exam  Constitutional: He is oriented to person, place, and time. He appears well-developed and well-nourished. No distress.  HENT:  Head: Normocephalic and atraumatic.  Eyes: Conjunctivae and EOM are normal. Pupils are equal, round, and reactive to light. No scleral icterus.  Neck: Normal range of motion. Neck supple. No tracheal deviation present. No thyromegaly present.  Cardiovascular: Normal rate, regular rhythm, normal heart sounds and intact distal pulses.   Respiratory: Effort normal and breath sounds normal. No respiratory distress.  GI: Soft. He exhibits no distension and no mass. There is tenderness (moderate to severe right lower quadrant abdominal pain. No diffuse peritoneal signs.). There is guarding. There is no rebound.  Lymphadenopathy:    He has no cervical adenopathy.  Neurological: He is alert and oriented to person, place, and time.  Skin: Skin is warm and dry.     Assessment/Plan Perforated appendicitis with phlegmon. At this time a long discussion with the patient regarding the findings. It is evident patient has a phlegmon in the right lower quadrant consistent with perforation of his appendicitis. Clinically patient is stable at this time we'll continue patient on  IV antibiotics. Surgical indications were discussed with patient however this time patient will continue to be treated conservatively and monitor closely. Should his symptomatology worsen despite conservative management he will be taken to the operating room at that time. Should patient develop an abscess in the area of the phlegmon CT drainage may be appropriate however at this time there is no evidence for any drainable fluid collection. Patient will be continued on IV antibiotics. Continued on ice chips and sips for now. Continued on activity as tolerated. DVT prophylaxis be maintained.  Sadaf Przybysz C 07/10/2012, 1:08 PM

## 2012-07-10 NOTE — Care Management Note (Unsigned)
    Page 1 of 1   07/10/2012     11:10:34 AM   CARE MANAGEMENT NOTE 07/10/2012  Patient:  EUAL, LINDSTROM   Account Number:  [id number]  Date Initiated:  07/10/2012  Documentation initiated by:  Sharrie Rothman  Subjective/Objective Assessment:   Pt admitted from home with appendicitis with rupture. Pt lives alone and will return home at discharge. Pt is independent with ADL's.     Action/Plan:   No CM or HH needs noted.   Anticipated DC Date:  07/12/2012   Anticipated DC Plan:  HOME/SELF CARE      DC Planning Services  CM consult      Choice offered to / List presented to:             Status of service:  Completed, signed off Medicare Important Message given?   (If response is "NO", the following Medicare IM given date fields will be blank) Date Medicare IM given:   Date Additional Medicare IM given:    Discharge Disposition:    Per UR Regulation:    If discussed at Long Length of Stay Meetings, dates discussed:    Comments:  07/10/12 1110 Arlyss Queen, RN BSN CM

## 2012-07-10 NOTE — Progress Notes (Signed)
UR Chart Review Completed  

## 2012-07-11 NOTE — Progress Notes (Signed)
  Subjective: Pain better.  No fever chills.  No nausea.  NO change with BM.  +normal BM.  Objective: Vital signs in last 24 hours: Temp:  [98.3 F (36.8 C)-100.9 F (38.3 C)] 100.4 F (38 C) (02/20 1850) Pulse Rate:  [95-105] 98 (02/20 1850) Resp:  [18-20] 20 (02/20 1850) BP: (104-117)/(64-76) 104/64 mmHg (02/20 1850) SpO2:  [93 %-97 %] 94 % (02/20 1850) Last BM Date: 07/03/12  Intake/Output from previous day: 02/19 0701 - 02/20 0700 In: 960 [P.O.:960] Out: 350 [Urine:350] Intake/Output this shift:    General appearance: alert and no distress Resp: clear to auscultation bilaterally Cardio: regular rate and rhythm GI: +BS, soft, expected tenderness.  No difuse peritoneal signs.  Lab Results:   Recent Labs  07/09/12 1445 07/10/12 0455  WBC 9.8 11.5*  HGB 14.2 12.4*  HCT 42.2 36.7*  PLT 192 174   BMET  Recent Labs  07/09/12 1445 07/10/12 0455  NA 133* 136  K 3.2* 3.1*  CL 93* 98  CO2 26 25  GLUCOSE 93 127*  BUN 14 20  CREATININE 1.20 1.32  CALCIUM 9.7 9.0   PT/INR No results found for this basename: LABPROT, INR,  in the last 72 hours ABG No results found for this basename: PHART, PCO2, PO2, HCO3,  in the last 72 hours  Studies/Results: No results found.  Anti-infectives: Anti-infectives   Start     Dose/Rate Route Frequency Ordered Stop   07/09/12 1730  ertapenem (INVANZ) 1 g in sodium chloride 0.9 % 50 mL IVPB     1 g 100 mL/hr over 30 Minutes Intravenous Every 24 hours 07/09/12 1649     07/09/12 1615  ertapenem (INVANZ) 1 g in sodium chloride 0.9 % 50 mL IVPB  Status:  Discontinued     1 g 100 mL/hr over 30 Minutes Intravenous Every 24 hours 07/09/12 1602 07/09/12 1647   07/09/12 1445  imipenem-cilastatin (PRIMAXIN) 500 mg in sodium chloride 0.9 % 100 mL IVPB     500 mg 200 mL/hr over 30 Minutes Intravenous  Once 07/09/12 1443 07/09/12 1553      Assessment/Plan: s/p * No surgery found * Perferated appendicitis with phlegmon.  Continue  abx.  Advance diet as tolerated.  Responding appropriately to treatment.    LOS: 2 days    Daphene Chisholm C 07/11/2012

## 2012-07-12 LAB — CBC
HCT: 34.6 % — ABNORMAL LOW (ref 39.0–52.0)
MCV: 83.4 fL (ref 78.0–100.0)
RBC: 4.15 MIL/uL — ABNORMAL LOW (ref 4.22–5.81)
RDW: 13.7 % (ref 11.5–15.5)
WBC: 6.6 10*3/uL (ref 4.0–10.5)

## 2012-07-12 MED ORDER — SUCCINYLCHOLINE CHLORIDE 20 MG/ML IJ SOLN
INTRAMUSCULAR | Status: AC
  Filled 2012-07-12: qty 1

## 2012-07-12 MED ORDER — MIDAZOLAM HCL 2 MG/2ML IJ SOLN
INTRAMUSCULAR | Status: AC
  Filled 2012-07-12: qty 2

## 2012-07-12 MED ORDER — SODIUM CHLORIDE 0.9 % IJ SOLN
3.0000 mL | INTRAMUSCULAR | Status: DC | PRN
  Administered 2012-07-12: 3 mL via INTRAVENOUS

## 2012-07-12 MED ORDER — PROPOFOL 10 MG/ML IV EMUL
INTRAVENOUS | Status: AC
  Filled 2012-07-12: qty 20

## 2012-07-12 MED ORDER — AMOXICILLIN-POT CLAVULANATE 500-125 MG PO TABS: 1.0000 | ORAL_TABLET | Freq: Three times a day (TID) | ORAL | Status: DC

## 2012-07-12 MED ORDER — ROCURONIUM BROMIDE 50 MG/5ML IV SOLN
INTRAVENOUS | Status: AC
  Filled 2012-07-12: qty 1

## 2012-07-12 MED ORDER — HYDROCODONE-ACETAMINOPHEN 5-325 MG PO TABS: 1.0000 | ORAL_TABLET | ORAL | Status: DC | PRN

## 2012-07-12 MED ORDER — SUFENTANIL CITRATE 50 MCG/ML IV SOLN
INTRAVENOUS | Status: AC
  Filled 2012-07-12: qty 1

## 2012-07-12 MED ORDER — HYDROMORPHONE HCL PF 1 MG/ML IJ SOLN: 1.0000 mg | INTRAMUSCULAR | Status: DC | PRN

## 2012-07-12 MED ORDER — LIDOCAINE HCL (PF) 1 % IJ SOLN
INTRAMUSCULAR | Status: AC
  Filled 2012-07-12: qty 5

## 2012-07-12 NOTE — Progress Notes (Signed)
  Subjective: Tolerating full liquids. Pain is improved. No fevers or chills. Positive bowel function.  Objective: Vital signs in last 24 hours: Temp:  [98.3 F (36.8 C)-100.4 F (38 C)] 99.1 F (37.3 C) (02/21 0611) Pulse Rate:  [91-98] 91 (02/21 0611) Resp:  [18-20] 20 (02/21 0611) BP: (104-126)/(64-77) 124/77 mmHg (02/21 0611) SpO2:  [94 %-97 %] 96 % (02/21 0611) Last BM Date: 07/11/12  Intake/Output from previous day: 02/20 0701 - 02/21 0700 In: 960 [P.O.:960] Out: 200 [Urine:200] Intake/Output this shift: Total I/O In: 240 [P.O.:240] Out: 500 [Urine:500]  General appearance: alert and no distress GI: Soft, mild to moderate right lower quadrant tenderness. No peritoneal signs.  Lab Results:   Recent Labs  07/10/12 0455 07/12/12 0551  WBC 11.5* 6.6  HGB 12.4* 11.6*  HCT 36.7* 34.6*  PLT 174 153   BMET  Recent Labs  07/09/12 1445 07/10/12 0455  NA 133* 136  K 3.2* 3.1*  CL 93* 98  CO2 26 25  GLUCOSE 93 127*  BUN 14 20  CREATININE 1.20 1.32  CALCIUM 9.7 9.0   PT/INR No results found for this basename: LABPROT, INR,  in the last 72 hours ABG No results found for this basename: PHART, PCO2, PO2, HCO3,  in the last 72 hours  Studies/Results: No results found.  Anti-infectives: Anti-infectives   Start     Dose/Rate Route Frequency Ordered Stop   07/09/12 1730  ertapenem (INVANZ) 1 g in sodium chloride 0.9 % 50 mL IVPB     1 g 100 mL/hr over 30 Minutes Intravenous Every 24 hours 07/09/12 1649     07/09/12 1615  ertapenem (INVANZ) 1 g in sodium chloride 0.9 % 50 mL IVPB  Status:  Discontinued     1 g 100 mL/hr over 30 Minutes Intravenous Every 24 hours 07/09/12 1602 07/09/12 1647   07/09/12 1445  imipenem-cilastatin (PRIMAXIN) 500 mg in sodium chloride 0.9 % 100 mL IVPB     500 mg 200 mL/hr over 30 Minutes Intravenous  Once 07/09/12 1443 07/09/12 1553      Assessment/Plan: s/p * No surgery found * Perforated contained appendicitis with  associated phlegmon. Continue antibiotics. Continue to advance diet. Likely discharge later today depending patient's continued progress. We'll discharge on oral antibiotics.  LOS: 3 days    Tyrene Nader C 07/12/2012

## 2012-07-12 NOTE — Progress Notes (Signed)
Patient given discharge instructions with no questions. Asked Dr. Leticia Penna to call augmentin in to [facility] in [place] so patient had it for immediate use. Patient taken out of facility by staff in wheelchair. Left with wife.

## 2012-07-25 ENCOUNTER — Other Ambulatory Visit (HOSPITAL_COMMUNITY): Payer: Self-pay | Admitting: General Surgery

## 2012-07-25 DIAGNOSIS — R52 Pain, unspecified: Secondary | ICD-10-CM

## 2012-07-30 ENCOUNTER — Ambulatory Visit (HOSPITAL_COMMUNITY)
Admission: RE | Admit: 2012-07-30 | Discharge: 2012-07-30 | Disposition: A | Discharge status: Home / Self Care | Payer: Managed Care, Other (non HMO) | Source: Ambulatory Visit | Attending: General Surgery | Admitting: General Surgery

## 2012-07-30 DIAGNOSIS — K389 Disease of appendix, unspecified: Secondary | ICD-10-CM | POA: Insufficient documentation

## 2012-07-30 DIAGNOSIS — R1031 Right lower quadrant pain: Secondary | ICD-10-CM | POA: Insufficient documentation

## 2012-07-30 DIAGNOSIS — R52 Pain, unspecified: Secondary | ICD-10-CM

## 2012-07-30 MED ORDER — IOHEXOL 300 MG/ML  SOLN
100.0000 mL | Freq: Once | INTRAMUSCULAR | Status: AC | PRN
  Administered 2012-07-30: 100 mL via INTRAVENOUS

## 2012-08-12 NOTE — Discharge Summary (Signed)
Physician Discharge Summary  Patient ID: Leonard Stark MRN: [id number] DOB/AGE: [birth year] 62 y.o.  Admit date: 07/09/2012 Discharge date: 07/12/2012  Admission Diagnoses: Ruptured appendicitis with phlegmon  Discharge Diagnoses: The same Active Problems:   * No active hospital problems. *   Discharged Condition: stable  Hospital Course: Patient presented to [facility] with RLQ pain.  Work-up demonstrated contained perforation of the appendix.  Patient was admitted for antibiotic management.  Continued to demonstrate clinical improvement.  Patient was discharged on oral abx with plans for continued outpatient management and possible interval appendectomy.  Consults: None  Significant Diagnostic Studies: labs: daily  Treatments: antibiotics: Invanz  Discharge Exam: Blood pressure 124/77, pulse 91, temperature 99.1 F (37.3 C), temperature source Oral, resp. rate 20, height 6\' 1"  (1.854 m), weight 94.1 kg (207 lb 7.3 oz), SpO2 96.00%. General appearance: alert and no distress Resp: clear to auscultation bilaterally Cardio: regular rate and rhythm GI: +BS, soft, moderate RLQ pain.  No peritoneal signs.    Disposition: 01-Home or Self Care  Discharge Orders   Future Orders Complete By Expires     Diet - low sodium heart healthy  As directed     Discharge instructions  As directed     Comments:      Increase diet as tolerated.    Increase activity slowly  As directed         Medication List    TAKE these medications       amoxicillin-clavulanate 500-125 MG per tablet  Commonly known as:  AUGMENTIN  Take 1 tablet (500 mg total) by mouth 3 (three) times daily.     aspirin EC 81 MG tablet  Take 81 mg by mouth every morning.     atenolol 50 MG tablet  Commonly known as:  TENORMIN  Take 50 mg by mouth every morning.     HYDROcodone-acetaminophen 5-325 MG per tablet  Commonly known as:  NORCO/VICODIN  Take 1-2 tablets by mouth every 4 (four) hours as needed.     nebivolol 5  MG tablet  Commonly known as:  BYSTOLIC  Take 5 mg by mouth every morning.     pravastatin 20 MG tablet  Commonly known as:  PRAVACHOL  Take 20 mg by mouth every morning.           Follow-up Information   Follow up with Fabio Bering, MD On 07/25/2012. (2pm or sooner if symptoms worsen)    Contact information:   [address] [place] [address] [phone]       Signed: Fabio Bering 08/12/2012, 9:13 PM

## 2012-12-30 ENCOUNTER — Emergency Department (HOSPITAL_COMMUNITY): Payer: Managed Care, Other (non HMO)

## 2012-12-30 ENCOUNTER — Emergency Department (HOSPITAL_COMMUNITY)
Admission: EM | Admit: 2012-12-30 | Discharge: 2012-12-30 | Disposition: A | Discharge status: Home / Self Care | Payer: Managed Care, Other (non HMO) | Attending: Emergency Medicine | Admitting: Emergency Medicine

## 2012-12-30 ENCOUNTER — Encounter (HOSPITAL_COMMUNITY): Payer: Self-pay | Admitting: *Deleted

## 2012-12-30 DIAGNOSIS — R319 Hematuria, unspecified: Secondary | ICD-10-CM | POA: Insufficient documentation

## 2012-12-30 DIAGNOSIS — R110 Nausea: Secondary | ICD-10-CM | POA: Insufficient documentation

## 2012-12-30 DIAGNOSIS — E7800 Pure hypercholesterolemia, unspecified: Secondary | ICD-10-CM | POA: Insufficient documentation

## 2012-12-30 DIAGNOSIS — I10 Essential (primary) hypertension: Secondary | ICD-10-CM | POA: Insufficient documentation

## 2012-12-30 LAB — CBC WITH DIFFERENTIAL/PLATELET
Eosinophils Absolute: 0.6 10*3/uL (ref 0.0–0.7)
Eosinophils Relative: 9 % — ABNORMAL HIGH (ref 0–5)
Hemoglobin: 14 g/dL (ref 13.0–17.0)
Lymphs Abs: 2.6 10*3/uL (ref 0.7–4.0)
MCH: 28.7 pg (ref 26.0–34.0)
MCV: 83.8 fL (ref 78.0–100.0)
Monocytes Relative: 10 % (ref 3–12)
RBC: 4.88 MIL/uL (ref 4.22–5.81)

## 2012-12-30 LAB — COMPREHENSIVE METABOLIC PANEL
Alkaline Phosphatase: 57 U/L (ref 39–117)
BUN: 25 mg/dL — ABNORMAL HIGH (ref 6–23)
Calcium: 10 mg/dL (ref 8.4–10.5)
GFR calc Af Amer: 44 mL/min — ABNORMAL LOW (ref >90)
Glucose, Bld: 114 mg/dL — ABNORMAL HIGH (ref 70–99)
Total Protein: 7.8 g/dL (ref 6.0–8.3)

## 2012-12-30 LAB — URINE MICROSCOPIC-ADD ON

## 2012-12-30 LAB — URINALYSIS, ROUTINE W REFLEX MICROSCOPIC
Bilirubin Urine: NEGATIVE
Specific Gravity, Urine: >1.03 — ABNORMAL HIGH (ref 1.005–1.030)
pH: 6 (ref 5.0–8.0)

## 2012-12-30 MED ORDER — ONDANSETRON HCL 4 MG/2ML IJ SOLN
4.0000 mg | Freq: Once | INTRAMUSCULAR | Status: AC
  Administered 2012-12-30: 4 mg via INTRAVENOUS
  Filled 2012-12-30: qty 2

## 2012-12-30 MED ORDER — HYDROMORPHONE HCL PF 1 MG/ML IJ SOLN
1.0000 mg | Freq: Once | INTRAMUSCULAR | Status: AC
  Administered 2012-12-30: 1 mg via INTRAVENOUS
  Filled 2012-12-30: qty 1

## 2012-12-30 MED ORDER — HYDROCODONE-ACETAMINOPHEN 5-325 MG PO TABS: 1.0000 | ORAL_TABLET | Freq: Four times a day (QID) | ORAL | Status: DC | PRN

## 2012-12-30 MED ORDER — CEPHALEXIN 500 MG PO CAPS: 500.0000 mg | ORAL_CAPSULE | Freq: Four times a day (QID) | ORAL | Status: DC

## 2012-12-30 MED ORDER — ONDANSETRON 4 MG PO TBDP: ORAL_TABLET | ORAL | Status: DC

## 2012-12-30 NOTE — ED Notes (Signed)
C/o pain to right flank since yesterday with hematuria and nausea.  Denies burning with urination, denies vomiting.

## 2012-12-30 NOTE — ED Provider Notes (Signed)
CSN: [id number]     Arrival date & time 12/30/12  1809 History  This chart was scribed for Benny Lennert, MD, by Yevette Edwards, ED Scribe. This patient was seen in room APA01/[facility]01 and the patient's care was started at 6:26 PM.   First MD Initiated Contact with Patient 12/30/12 1819     Chief Complaint  Patient presents with  . Flank Pain    Patient is a 62 y.o. male presenting with flank pain and hematuria. The history is provided by the patient. No language interpreter was used.  Flank Pain This is a recurrent problem. The current episode started more than 2 days ago. Episode frequency: Intermittently. The problem has not changed since onset.Associated symptoms include abdominal pain. Nothing aggravates the symptoms. Nothing relieves the symptoms. He has tried nothing for the symptoms.  Hematuria Associated symptoms include abdominal pain.   HPI Comments:  Leonard Stark is a 62 y.o. male who presents to the Emergency Department complaining of intermittent right-sided flank pain which has been occurring for "a while," but which worsened yesterday. The pt reports that he has experienced hematuria and nausea in association with the flank pain. The flank pain intermittently radiates to his right abdomen. He denies experiencing any dysuria or emesis. The pt denies a h/o kidney calculi, but he reports he visited the ED several months ago for a ruptured appendix, though the pt though that it was associated with his kidneys. The pt has HTN and hypercholesterolemia.  He denies both smoking and using alcohol.   Past Medical History  Diagnosis Date  . Hypertension   . Hypercholesterolemia    Past Surgical History  Procedure Laterality Date  . Cardiac catheterization  2011  . Colonoscopy  10/13/2011    Procedure: COLONOSCOPY;  Surgeon: Corbin Ade, MD;  Location: [facility];  Service: Endoscopy;  Laterality: N/A;  10:00 AM   No family history on file. History  Substance Use Topics  .  Smoking status: Never Smoker   . Smokeless tobacco: Not on file  . Alcohol Use: No    Review of Systems  Gastrointestinal: Positive for nausea and abdominal pain. Negative for vomiting.  Genitourinary: Positive for hematuria and flank pain. Negative for dysuria.  All other systems reviewed and are negative.    Allergies  Review of patient's allergies indicates no known allergies.  Home Medications   Current Outpatient Rx  Name  Route  Sig  Dispense  Refill  . amoxicillin-clavulanate (AUGMENTIN) 500-125 MG per tablet   Oral   Take 1 tablet (500 mg total) by mouth 3 (three) times daily.   21 tablet   0   . aspirin EC 81 MG tablet   Oral   Take 81 mg by mouth every morning.         Marland Kitchen atenolol (TENORMIN) 50 MG tablet   Oral   Take 50 mg by mouth every morning.         Marland Kitchen HYDROcodone-acetaminophen (NORCO/VICODIN) 5-325 MG per tablet   Oral   Take 1-2 tablets by mouth every 4 (four) hours as needed.   45 tablet   0   . nebivolol (BYSTOLIC) 5 MG tablet   Oral   Take 5 mg by mouth every morning.          . pravastatin (PRAVACHOL) 20 MG tablet   Oral   Take 20 mg by mouth every morning.          Triage Vitals: BP 136/72  Pulse 69  Temp(Src) 97.9 F (36.6 C) (Oral)  Resp 20  Ht 6\' 1"  (1.854 m)  Wt 206 lb (93.441 kg)  BMI 27.18 kg/m2  SpO2 96%  Physical Exam  Nursing note and vitals reviewed. Constitutional: He is oriented to person, place, and time. He appears well-developed and well-nourished.  HENT:  Head: Normocephalic.  Eyes: Conjunctivae and EOM are normal. No scleral icterus.  Neck: Neck supple. No thyromegaly present.  Cardiovascular: Normal rate and regular rhythm.  Exam reveals no gallop and no friction rub.   No murmur heard. Pulmonary/Chest: Effort normal. No stridor. No respiratory distress. He has no wheezes. He has no rales. He exhibits no tenderness.  Abdominal: He exhibits no distension. There is no tenderness. There is no rebound.   Musculoskeletal: Normal range of motion. He exhibits no edema.  Moderate right flank tenderness.  Lymphadenopathy:    He has no cervical adenopathy.  Neurological: He is alert and oriented to person, place, and time. Coordination normal.  Skin: No rash noted. No erythema.  Psychiatric: He has a normal mood and affect. His behavior is normal.    ED Course   DIAGNOSTIC STUDIES:  Oxygen Saturation is 96% on room air, normal by my interpretation.    COORDINATION OF CARE:  6:30 PM- Discussed treatment plan with patient, and the patient agreed to the plan.   Procedures (including critical care time)  Labs Reviewed  URINALYSIS, ROUTINE W REFLEX MICROSCOPIC - Abnormal; Notable for the following:    Specific Gravity, Urine >1.030 (*)    Hgb urine dipstick MODERATE (*)    All other components within normal limits  CBC WITH DIFFERENTIAL - Abnormal; Notable for the following:    Neutrophils Relative % 38 (*)    Eosinophils Relative 9 (*)    All other components within normal limits  COMPREHENSIVE METABOLIC PANEL - Abnormal; Notable for the following:    Potassium 3.3 (*)    Glucose, Bld 114 (*)    BUN 25 (*)    Creatinine, Ser 1.86 (*)    GFR calc non Af Amer 38 (*)    GFR calc Af Amer 44 (*)    All other components within normal limits  URINE MICROSCOPIC-ADD ON - Abnormal; Notable for the following:    Casts HYALINE CASTS (*)    All other components within normal limits   Ct Abdomen Pelvis Wo Contrast  12/30/2012   *RADIOLOGY REPORT*  Clinical Data: Right-sided flank pain, hematuria, nausea history of ruptured appendix  CT ABDOMEN AND PELVIS WITHOUT CONTRAST  Technique:  Multidetector CT imaging of the abdomen and pelvis was performed following the standard protocol without intravenous contrast.  Comparison: 07/30/2012; 07/09/2012; 01/14/2010  Findings:  The lack of intravenous contrast limits the ability to evaluate solid abdominal organs.  Normal hepatic contour.  Normal  noncontrast appearance of the gallbladder given under distension.  No ascites.  There are multiple ill-defined punctate opacities within nearly all of the bilateral renal calyces which may represent scattered punctate (sub 4 mm) nonobstructing renal stones versus the sequela of medullary nephrocalcinosis.  No stones are seen along the expected course of either ureter or the urinary bladder.  Normal noncontrast appearance of the urinary bladder given degree distension. No urinary obstruction or perinephric stranding. Several phleboliths are noted within the lower pelvis, left greater than right.  Bilateral hypoattenuating previously characterized renal cysts are incompletely evaluated on this noncontrast examination though morphologically appear grossly unchanged.  Normal noncontrast appearance of the bilateral adrenal glands,  pancreas and spleen.  Moderate colonic stool burden without evidence of obstruction. Interval resolution of previously noted periappendiceal stranding. No pneumoperitoneum, pneumatosis or portal venous gas.  Scattered atherosclerotic plaque within a normal caliber abdominal aorta.  No definite retroperitoneal, mesenteric, pelvic or inguinal lymphadenopathy on this noncontrast examination.  Borderline enlargement of the prostate.  No free fluid within the pelvis.  Limited visualization of the lower thorax demonstrates a punctate (approximately 3 mm) nodule within the left lower lobe (image 4, series 3).  There is minimal bibasilar dependent subsegmental atelectasis.  No focal airspace opacity.  No pleural effusions.  Normal heart size.  No pericardial effusion.  No acute or aggressive osseous abnormalities.  IMPRESSION:  1.  Multiple punctate (<4 mm) poorly defined opacities are seen in nearly all of the bilateral renal calyces, similar to the 08/2003 examination, and while possibly representative of nonobstructing stones, medullary nephrocalcinosis may have a similar appearance. Clinical  correlation is advised.  2.  Interval apparent resolution of previously noted periappendiceal stranding on this noncontrast examination  3.  Punctate (approximately 3 mm nodule) within the left lower lobe, similar to the 07/2012 examination, although not definitely seen on prior more remote examinations.  If the patient is at high risk for bronchogenic carcinoma, follow-up chest CT at 1 year is recommended.  If the patient is at low risk, no follow-up is needed.  This recommendation follows the consensus statement: Guidelines for Management of Small Pulmonary Nodules Detected on CT Scans:  A Statement from the Fleischner Society as published in Radiology 2005; 237:395-400.   .   Original Report Authenticated By: Tacey Ruiz, MD   No diagnosis found.  MDM  Hematuia,  tx for uti and have pt follow up with his pcp in 2-3 days  The chart was scribed for me under my direct supervision.  I personally performed the history, physical, and medical decision making and all procedures in the evaluation of this patient.Benny Lennert, MD 12/30/12 2019

## 2013-01-01 LAB — URINE CULTURE

## 2013-09-19 DIAGNOSIS — Z86012 Personal history of benign carcinoid tumor: Secondary | ICD-10-CM

## 2013-09-19 DIAGNOSIS — D3A020 Benign carcinoid tumor of the appendix: Secondary | ICD-10-CM

## 2013-09-19 HISTORY — DX: Personal history of benign carcinoid tumor: Z86.012

## 2013-09-19 HISTORY — PX: APPENDECTOMY: SHX54

## 2013-09-19 HISTORY — DX: Benign carcinoid tumor of the appendix: D3A.020

## 2013-10-15 HISTORY — PX: LAPAROSCOPIC APPENDECTOMY: SUR753

## 2013-10-21 ENCOUNTER — Ambulatory Visit: Payer: Managed Care, Other (non HMO) | Admitting: Urology

## 2016-04-20 ENCOUNTER — Encounter: Payer: Self-pay | Admitting: Cardiovascular Disease

## 2016-04-27 ENCOUNTER — Encounter: Payer: Self-pay | Admitting: Cardiology

## 2016-05-04 ENCOUNTER — Encounter: Payer: Self-pay | Admitting: Cardiovascular Disease

## 2017-05-08 ENCOUNTER — Encounter: Payer: Self-pay | Admitting: Internal Medicine

## 2017-06-29 ENCOUNTER — Ambulatory Visit (INDEPENDENT_AMBULATORY_CARE_PROVIDER_SITE_OTHER): Payer: Managed Care, Other (non HMO) | Admitting: Gastroenterology

## 2017-06-29 ENCOUNTER — Encounter: Payer: Self-pay | Admitting: Gastroenterology

## 2017-06-29 DIAGNOSIS — K5900 Constipation, unspecified: Secondary | ICD-10-CM | POA: Diagnosis not present

## 2017-06-29 DIAGNOSIS — R1319 Other dysphagia: Secondary | ICD-10-CM | POA: Insufficient documentation

## 2017-06-29 DIAGNOSIS — K219 Gastro-esophageal reflux disease without esophagitis: Secondary | ICD-10-CM | POA: Diagnosis not present

## 2017-06-29 DIAGNOSIS — R1013 Epigastric pain: Secondary | ICD-10-CM | POA: Diagnosis not present

## 2017-06-29 DIAGNOSIS — R1310 Dysphagia, unspecified: Secondary | ICD-10-CM

## 2017-06-29 DIAGNOSIS — R1011 Right upper quadrant pain: Secondary | ICD-10-CM

## 2017-06-29 MED ORDER — LINACLOTIDE 290 MCG PO CAPS: 290.0000 ug | ORAL_CAPSULE | Freq: Every day | ORAL | 11 refills | Status: DC

## 2017-06-29 MED ORDER — PANTOPRAZOLE SODIUM 40 MG PO TBEC: 40.0000 mg | DELAYED_RELEASE_TABLET | Freq: Every day | ORAL | 11 refills | Status: DC

## 2017-06-29 NOTE — Patient Instructions (Signed)
1. I will review records from your appendix surgery and then make recommendations about colonoscopy and upper endoscopy at that time.  2. Start pantoprazole 40mg  once daily before breakfast for acid reflux. 3. Start Linzess 254mcg once daily before breakfast for constipation. Please call if you are not having a BM at least every other day or so.

## 2017-06-29 NOTE — Progress Notes (Signed)
Primary Care Physician:  Redmond School, MD  Primary Gastroenterologist:  Garfield Cornea, MD   Chief Complaint  Patient presents with  . Abdominal Pain    RUQ x 3-4 weeks, comes/goes    HPI:  Leonard Stark is a 67 y.o. male here presents at the request of Dr. Gerarda Fraction for ruq abdominal pain and ?colonoscopy.   Patient reports right flank pain, nagging to sharp in quality. Sometimes radiates across the back. Really doesn't note anything that makes it better or worse. Has long h/o constipation. States he has a BM about once per month. Doesn't take anything to help him go. No melena or brbpr. He complains of nocturnal heartburn especially if he eats after 6pm. Given some medicine by PCP which really helped but he is out. C/o liquid dysphagia. Feels like gets stuck in upper esophagus, sometimes comes back out. No problems with food. No odynophagia. No unintentional weight loss. No dysuria. H/o microscopic hematuria.   Patient's last colonoscopy was in 2013, poor prep compromised exam, benign polyps found. Advised to come back in 7 years.   Current Outpatient Medications  Medication Sig Dispense Refill  . acetaminophen (TYLENOL) 500 MG tablet Take 1,000 mg by mouth once as needed for pain.    Marland Kitchen aspirin EC 81 MG tablet Take 81 mg by mouth every morning.    Marland Kitchen atenolol (TENORMIN) 50 MG tablet Take 50 mg by mouth every morning.    . Coenzyme Q10 (COQ10) 100 MG CAPS Take by mouth daily.    . Omega-3 Fatty Acids (FISH OIL) 1000 MG CAPS Take 3 capsules by mouth daily.    . pravastatin (PRAVACHOL) 20 MG tablet Take 20 mg by mouth every morning.    . valsartan-hydrochlorothiazide (DIOVAN-HCT) 320-25 MG tablet Take 1 tablet by mouth daily.     No current facility-administered medications for this visit.     Allergies as of 06/29/2017  . (No Known Allergies)    Past Medical History:  Diagnosis Date  . 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 Hypercholesterolemia   .  Hypertension     Past Surgical History:  Procedure Laterality Date  . 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 CARDIAC CATHETERIZATION  2011  . COLONOSCOPY  10/13/2011   benign polyps, poor prep compromised exam, next TCS 7 years.     Family History  Problem Relation Age of Onset  . Colon cancer Neg Hx     Social History   Socioeconomic History  . Marital status: Divorced    Spouse name: Not on file  . Number of children: Not on file  . Years of education: Not on file  . Highest education level: Not on file  Social Needs  . Financial resource strain: Not on file  . Food insecurity - worry: Not on file  . Food insecurity - inability: Not on file  . Transportation needs - medical: Not on file  . Transportation needs - non-medical: Not on file  Occupational History  . Not on file  Tobacco Use  . Smoking status: Former Research scientist (life sciences)  . Smokeless tobacco: Never Used  Substance and Sexual Activity  . Alcohol use: No    Alcohol/week: 0.5 oz    Types: 1 Standard drinks or equivalent per week  . Drug use: No  . Sexual activity: Yes  Other Topics Concern  . Not on file  Social History Narrative  .  Not on file      ROS:  General: Negative for anorexia, weight loss, fever, chills, fatigue, weakness. Eyes: Negative for vision changes.  ENT: Negative for hoarseness, difficulty swallowing , nasal congestion. CV: Negative for chest pain, angina, palpitations, dyspnea on exertion, peripheral edema.  Respiratory: Negative for dyspnea at rest, dyspnea on exertion, cough, sputum, wheezing.  GI: See history of present illness. GU:  Negative for dysuria, hematuria, urinary incontinence, urinary frequency, nocturnal urination. See hpi MS: Negative for joint pain, low back pain.  Derm: Negative for rash or itching.  Neuro: Negative for weakness, abnormal sensation, seizure, frequent headaches, memory loss, confusion.  Psych: Negative  for anxiety, depression, suicidal ideation, hallucinations.  Endo: Negative for unusual weight change.  Heme: Negative for bruising or bleeding. Allergy: Negative for rash or hives.    Physical Examination:  BP 114/80   Pulse 71   Temp (!) 97.1 F (36.2 C) (Oral)   Ht 6\' 1"  (1.854 m)   Wt 206 lb (93.4 kg)   BMI 27.18 kg/m    General: Well-nourished, well-developed in no acute distress.  Head: Normocephalic, atraumatic.   Eyes: Conjunctiva pink, no icterus. Mouth: Oropharyngeal mucosa moist and pink , no lesions erythema or exudate. Neck: Supple without thyromegaly, masses, or lymphadenopathy.  Lungs: Clear to auscultation bilaterally.  Heart: Regular rate and rhythm, no murmurs rubs or gallops.  Abdomen: Bowel sounds are normal, ruq tenderness and epig tenderness, no cva tenderness, nondistended, no hepatosplenomegaly or masses, no abdominal bruits or    hernia , no rebound or guarding.   Rectal: not performed Extremities: No lower extremity edema. No clubbing or deformities.  Neuro: Alert and oriented x 4 , grossly normal neurologically.  Skin: Warm and dry, no rash or jaundice.   Psych: Alert and cooperative, normal mood and affect.  Labs: Labs from 04/2017 White blood cell count 6000, hemoglobin 14.1, hematocrit 41.5, platelets 232,000, BUN 16, creatinine 1.05, total bilirubin 0.5, alkaline phosphatase 71, AST 19, ALT 29, albumin 4.9, PSA 2.4, TSH 0.921  Imaging Studies: No results found.  Impression/Plan:  67 y/o male with prior appendectomy for ruptured appendix several years back (possible carcinoid tumor per PCP notes) who presents for RUQ/right flank pain in setting of severe constipation. Also with nighttime GERD, liquid dysphagia, epigastric pain on exam. Patient will need colonoscopy (prior poor prep) and possible upper endoscopy in near future (if symptoms don't settle down with PPI). I have discussed the risks, alternatives, benefits with regards to but not  limited to the risk of reaction to medication, bleeding, infection, perforation and the patient is agreeable to proceed. Written consent to be obtained.   Prior to scheduling, I would like to review previous records regarding possible carcinoid tumor. On exam today he had mild to moderate discomfort but overall fairly benign exam. Cannot exclude possibility of need of CT imaging in the future based on records and findings.   In the interim, he will begin pantoprazole 40 mg daily. Linzess 244mcg daily.

## 2017-07-02 NOTE — Progress Notes (Signed)
CC'D TO PCP °

## 2017-07-03 ENCOUNTER — Ambulatory Visit: Payer: Managed Care, Other (non HMO) | Admitting: Urology

## 2017-07-03 DIAGNOSIS — R3915 Urgency of urination: Secondary | ICD-10-CM | POA: Diagnosis not present

## 2017-07-03 DIAGNOSIS — N3281 Overactive bladder: Secondary | ICD-10-CM

## 2017-07-03 DIAGNOSIS — R311 Benign essential microscopic hematuria: Secondary | ICD-10-CM | POA: Diagnosis not present

## 2017-08-20 ENCOUNTER — Ambulatory Visit (HOSPITAL_COMMUNITY)
Admission: RE | Admit: 2017-08-20 | Discharge: 2017-08-20 | Disposition: A | Discharge status: Home / Self Care | Payer: Managed Care, Other (non HMO) | Source: Ambulatory Visit | Attending: Physician Assistant | Admitting: Physician Assistant

## 2017-08-20 ENCOUNTER — Encounter: Payer: Self-pay | Admitting: Gastroenterology

## 2017-08-20 ENCOUNTER — Other Ambulatory Visit (HOSPITAL_COMMUNITY): Payer: Self-pay | Admitting: Physician Assistant

## 2017-08-20 DIAGNOSIS — M25562 Pain in left knee: Secondary | ICD-10-CM

## 2017-08-20 NOTE — Progress Notes (Signed)
Please let patient know that I have reviewed his previous records regarding carcinoid tumor of the appendix. It looks like margins were not involved. Please give him my apologies for delay on reviewing this records.   How are his bowel movements on Linzess and how is his reflux on pantoprazole?

## 2017-08-20 NOTE — Progress Notes (Signed)
Lmom, waiting on a return call.  

## 2017-08-21 ENCOUNTER — Telehealth: Payer: Self-pay | Admitting: Internal Medicine

## 2017-08-21 NOTE — Progress Notes (Signed)
pts spouse notified and reports that the pt is doing better with both medications.

## 2017-08-21 NOTE — Telephone Encounter (Signed)
Pt's wife was returning a call from yesterday. Please call [phone]

## 2017-08-21 NOTE — Telephone Encounter (Signed)
Spoke with spouse. Documentation is in addendum note.

## 2017-08-27 NOTE — Progress Notes (Signed)
Can we bring him back in maybe Thursday at 1:30 to see me and discuss colonoscopy and possible EGD and get appropriate procedures scheduled.

## 2017-08-28 ENCOUNTER — Other Ambulatory Visit: Payer: Self-pay

## 2017-08-28 NOTE — Progress Notes (Signed)
Lmom, waiting on a return call.  

## 2017-08-28 NOTE — Progress Notes (Signed)
lmom waiting on a return call.  

## 2017-08-29 ENCOUNTER — Encounter: Payer: Self-pay | Admitting: Internal Medicine

## 2017-08-29 NOTE — Progress Notes (Signed)
APPT MADE AND LETTER SENT  °

## 2017-08-29 NOTE — Progress Notes (Signed)
Noted  

## 2017-09-03 ENCOUNTER — Encounter: Payer: Self-pay | Admitting: Gastroenterology

## 2017-09-03 ENCOUNTER — Other Ambulatory Visit: Payer: Self-pay

## 2017-09-03 ENCOUNTER — Ambulatory Visit: Payer: Managed Care, Other (non HMO) | Admitting: Gastroenterology

## 2017-09-03 VITALS — BP 120/84 | HR 68 | Temp 97.2°F | Ht 73.0 in | Wt 206.0 lb

## 2017-09-03 DIAGNOSIS — K219 Gastro-esophageal reflux disease without esophagitis: Secondary | ICD-10-CM

## 2017-09-03 DIAGNOSIS — R1310 Dysphagia, unspecified: Secondary | ICD-10-CM

## 2017-09-03 DIAGNOSIS — R1319 Other dysphagia: Secondary | ICD-10-CM

## 2017-09-03 DIAGNOSIS — K5900 Constipation, unspecified: Secondary | ICD-10-CM

## 2017-09-03 DIAGNOSIS — R1011 Right upper quadrant pain: Secondary | ICD-10-CM

## 2017-09-03 DIAGNOSIS — R109 Unspecified abdominal pain: Secondary | ICD-10-CM

## 2017-09-03 MED ORDER — PEG 3350-KCL-NA BICARB-NACL 420 G PO SOLR: 4000.0000 mL | ORAL | 0 refills | Status: DC

## 2017-09-03 NOTE — Patient Instructions (Signed)
Colonoscopy and upper endoscopy as scheduled.  Please see separate instructions.  Continue pantoprazole 40 mg daily for acid reflux.  Continue Linzess 290 mcg daily for constipation.

## 2017-09-03 NOTE — Progress Notes (Signed)
CC'D TO PCP °

## 2017-09-03 NOTE — Progress Notes (Signed)
Primary Care Physician:  Redmond School, MD  Primary Gastroenterologist:  Garfield Cornea, MD   Chief Complaint  Patient presents with  . Abdominal Pain    right side, off/on    HPI:  Leonard Stark is a 67 y.o. male here for follow-up.  He was seen back in February initially for right upper quadrant abdominal pain and consideration of colonoscopy.  Complaint of right flank pain, nagging to sharp in quality sometimes radiating into the back.  Nothing really making it better or worse.  Chronic constipation reporting sometimes a bowel movement only once or twice per month.  Patient had also complained of heartburn especially at night, liquid dysphagia.  Last colonoscopy 2013, poor prep compromised exam, benign polyps found.  Advised to come back in 7 years.  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  Reviewed operative report and path, no margins were involved.  At his last office visit I started him on pantoprazole 40 mg daily and Linzess 290 mcg daily. Still with liquid dysphagia.  He tells me his typical heartburn is much better.  No nausea or vomiting.  Right flank pain unaffected by PPI therapy.  Bowel function much improved now having at least 2-3 stools per week, soft stools.  Again did not help his right flank pain.  No melena or rectal bleeding.  No weight loss.    Current Outpatient Medications  Medication Sig Dispense Refill  . acetaminophen (TYLENOL) 500 MG tablet Take 1,000 mg by mouth once as needed for pain.    Marland Kitchen aspirin EC 81 MG tablet Take 81 mg by mouth every morning.    Marland Kitchen atenolol (TENORMIN) 50 MG tablet Take 50 mg by mouth every morning.    . Coenzyme Q10 (COQ10) 100 MG CAPS Take by mouth daily.    Marland Kitchen linaclotide (LINZESS) 290 MCG CAPS capsule Take 1 capsule (290 mcg total) by mouth daily before breakfast. 30 capsule 11  . Omega-3 Fatty Acids (FISH OIL) 1000 MG CAPS Take 3 capsules by mouth daily.    . pantoprazole  (PROTONIX) 40 MG tablet Take 1 tablet (40 mg total) by mouth daily before breakfast. 30 tablet 11  . pravastatin (PRAVACHOL) 20 MG tablet Take 20 mg by mouth every morning.    . valsartan-hydrochlorothiazide (DIOVAN-HCT) 320-25 MG tablet Take 1 tablet by mouth daily.     No current facility-administered medications for this visit.     Allergies as of 09/03/2017  . (No Known Allergies)    Past Medical History:  Diagnosis Date  . 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  Marland Kitchen Hypercholesterolemia   . Hypertension     Past Surgical History:  Procedure Laterality Date  . APPENDECTOMY  09/2013   Carcinoid tumor, low-grade, 0.4 cm in maximal dimension coming to 0.3 cm of the closest mesenteric resection margin.  Acute appendicitis with perforation on path as well.  Marland Kitchen CARDIAC CATHETERIZATION  2011  . COLONOSCOPY  10/13/2011   benign polyps, poor prep compromised exam, next TCS 7 years.     Family History  Problem Relation Age of Onset  . Colon cancer Neg Hx     Social History   Socioeconomic History  . Marital status: Divorced    Spouse name: Not on file  . Number of children: Not on file  . Years of education: Not on file  . Highest education level: Not on file  Occupational History  .  Not on file  Social Needs  . Financial resource strain: Not on file  . Food insecurity:    Worry: Not on file    Inability: Not on file  . Transportation needs:    Medical: Not on file    Non-medical: Not on file  Tobacco Use  . Smoking status: Former Research scientist (life sciences)  . Smokeless tobacco: Never Used  Substance and Sexual Activity  . Alcohol use: No    Alcohol/week: 0.5 oz    Types: 1 Standard drinks or equivalent per week  . Drug use: No  . Sexual activity: Yes  Lifestyle  . Physical activity:    Days per week: Not on file    Minutes per session: Not on file  . Stress: Not on file  Relationships  . Social connections:    Talks on phone:  Not on file    Gets together: Not on file    Attends religious service: Not on file    Active member of club or organization: Not on file    Attends meetings of clubs or organizations: Not on file    Relationship status: Not on file  . Intimate partner violence:    Fear of current or ex partner: Not on file    Emotionally abused: Not on file    Physically abused: Not on file    Forced sexual activity: Not on file  Other Topics Concern  . Not on file  Social History Narrative  . Not on file      ROS:  General: Negative for anorexia, weight loss, fever, chills, fatigue, weakness. Eyes: Negative for vision changes.  ENT: Negative for hoarseness, difficulty swallowing , nasal congestion. CV: Negative for chest pain, angina, palpitations, dyspnea on exertion, peripheral edema.  Respiratory: Negative for dyspnea at rest, dyspnea on exertion, cough, sputum, wheezing.  GI: See history of present illness. GU:  Negative for dysuria, hematuria, urinary incontinence, urinary frequency, nocturnal urination.  MS: Negative for joint pain, low back pain.  Derm: Negative for rash or itching.  Neuro: Negative for weakness, abnormal sensation, seizure, frequent headaches, memory loss, confusion.  Psych: Negative for anxiety, depression, suicidal ideation, hallucinations.  Endo: Negative for unusual weight change.  Heme: Negative for bruising or bleeding. Allergy: Negative for rash or hives.    Physical Examination:  BP 120/84   Pulse 68   Temp (!) 97.2 F (36.2 C) (Oral)   Ht 6\' 1"  (1.854 m)   Wt 206 lb (93.4 kg)   BMI 27.18 kg/m    General: Well-nourished, well-developed in no acute distress.  Head: Normocephalic, atraumatic.   Eyes: Conjunctiva pink, no icterus. Mouth: Oropharyngeal mucosa moist and pink , no lesions erythema or exudate. Neck: Supple without thyromegaly, masses, or lymphadenopathy.  Lungs: Clear to auscultation bilaterally.  Heart: Regular rate and rhythm, no  murmurs rubs or gallops.  Abdomen: Bowel sounds are normal, nontender, nondistended, no hepatosplenomegaly or masses, no abdominal bruits or    hernia , no rebound or guarding.   Rectal: Deferred Extremities: No lower extremity edema. No clubbing or deformities.  Neuro: Alert and oriented x 4 , grossly normal neurologically.  Skin: Warm and dry, no rash or jaundice.   Psych: Alert and cooperative, normal mood and affect.  Imaging Studies: Dg Knee Complete 4 Views Left  Result Date: 08/20/2017 CLINICAL DATA:  Knee pain and swelling for 3 days.  No known injury. EXAM: LEFT KNEE - COMPLETE 4+ VIEW COMPARISON:  None. FINDINGS: The mineralization and alignment are  normal. There is no evidence of acute fracture or dislocation. The joint spaces are maintained. There is no significant joint effusion. Minimal spurring at the quadriceps insertion on the patella. Possible mild prepatellar soft tissue swelling. IMPRESSION: No acute osseous findings or significant arthropathic changes. Electronically Signed   By: Richardean Sale M.D.   On: 08/20/2017 17:24    Impression/plan:  66 year old gentleman with prior appendectomy for ruptured appendix back in 2015, found to have carcinoid tumor incidentally during his appendectomy who presents with right upper quadrant/right flank pain, constipation, GERD, liquid dysphagia.  Etiology of right upper quadrant/right flank pain unclear, no improvement on PPI or with management of his constipation.  Continues to have liquid dysphagia.  His constipation is better managed on Linzess.  Due to poor prep on prior colonoscopy back in 2013, will go ahead and advise repeating colonoscopy at this time.  In addition to Carlin we will also augment bowel prep with additional Dulcolax 15 mg daily for 3 days prior to bowel prep.  Colonoscopy, EGD, dilation in the near future.  I have discussed the risks, alternatives, benefits with regards to but not limited to the risk of reaction to  medication, bleeding, infection, perforation and the patient is agreeable to proceed. Written consent to be obtained.

## 2017-09-17 ENCOUNTER — Ambulatory Visit: Payer: Managed Care, Other (non HMO) | Admitting: Gastroenterology

## 2017-10-02 ENCOUNTER — Ambulatory Visit (INDEPENDENT_AMBULATORY_CARE_PROVIDER_SITE_OTHER): Payer: Managed Care, Other (non HMO) | Admitting: Urology

## 2017-10-02 DIAGNOSIS — R3915 Urgency of urination: Secondary | ICD-10-CM

## 2017-11-09 ENCOUNTER — Other Ambulatory Visit: Payer: Self-pay

## 2017-11-09 ENCOUNTER — Encounter (HOSPITAL_COMMUNITY)
Admission: RE | Disposition: A | Discharge status: Home / Self Care | Payer: Self-pay | Source: Ambulatory Visit | Attending: Internal Medicine

## 2017-11-09 ENCOUNTER — Encounter (HOSPITAL_COMMUNITY): Payer: Self-pay | Admitting: *Deleted

## 2017-11-09 ENCOUNTER — Ambulatory Visit (HOSPITAL_COMMUNITY)
Admission: RE | Admit: 2017-11-09 | Discharge: 2017-11-09 | Disposition: A | Discharge status: Home / Self Care | Payer: Managed Care, Other (non HMO) | Source: Ambulatory Visit | Attending: Internal Medicine | Admitting: Internal Medicine

## 2017-11-09 DIAGNOSIS — K6389 Other specified diseases of intestine: Secondary | ICD-10-CM | POA: Insufficient documentation

## 2017-11-09 DIAGNOSIS — K449 Diaphragmatic hernia without obstruction or gangrene: Secondary | ICD-10-CM | POA: Insufficient documentation

## 2017-11-09 DIAGNOSIS — Z79899 Other long term (current) drug therapy: Secondary | ICD-10-CM | POA: Diagnosis not present

## 2017-11-09 DIAGNOSIS — Z1212 Encounter for screening for malignant neoplasm of rectum: Secondary | ICD-10-CM | POA: Diagnosis not present

## 2017-11-09 DIAGNOSIS — Z1211 Encounter for screening for malignant neoplasm of colon: Secondary | ICD-10-CM | POA: Diagnosis not present

## 2017-11-09 DIAGNOSIS — E7800 Pure hypercholesterolemia, unspecified: Secondary | ICD-10-CM | POA: Diagnosis not present

## 2017-11-09 DIAGNOSIS — Q438 Other specified congenital malformations of intestine: Secondary | ICD-10-CM | POA: Insufficient documentation

## 2017-11-09 DIAGNOSIS — K5900 Constipation, unspecified: Secondary | ICD-10-CM

## 2017-11-09 DIAGNOSIS — R1319 Other dysphagia: Secondary | ICD-10-CM

## 2017-11-09 DIAGNOSIS — R109 Unspecified abdominal pain: Secondary | ICD-10-CM

## 2017-11-09 DIAGNOSIS — R1310 Dysphagia, unspecified: Secondary | ICD-10-CM | POA: Diagnosis not present

## 2017-11-09 DIAGNOSIS — Z87891 Personal history of nicotine dependence: Secondary | ICD-10-CM | POA: Insufficient documentation

## 2017-11-09 DIAGNOSIS — I10 Essential (primary) hypertension: Secondary | ICD-10-CM | POA: Insufficient documentation

## 2017-11-09 DIAGNOSIS — K219 Gastro-esophageal reflux disease without esophagitis: Secondary | ICD-10-CM

## 2017-11-09 DIAGNOSIS — Z7982 Long term (current) use of aspirin: Secondary | ICD-10-CM | POA: Diagnosis not present

## 2017-11-09 HISTORY — PX: ESOPHAGOGASTRODUODENOSCOPY: SHX5428

## 2017-11-09 HISTORY — PX: MALONEY DILATION: SHX5535

## 2017-11-09 HISTORY — PX: COLONOSCOPY: SHX5424

## 2017-11-09 SURGERY — COLONOSCOPY
Anesthesia: Moderate Sedation

## 2017-11-09 MED ORDER — LIDOCAINE VISCOUS HCL 2 % MT SOLN
OROMUCOSAL | Status: DC | PRN
  Administered 2017-11-09: 1 via OROMUCOSAL

## 2017-11-09 MED ORDER — MEPERIDINE HCL 100 MG/ML IJ SOLN
INTRAMUSCULAR | Status: DC | PRN
  Administered 2017-11-09: 50 mg via INTRAVENOUS
  Administered 2017-11-09: 25 mg via INTRAVENOUS

## 2017-11-09 MED ORDER — LIDOCAINE VISCOUS HCL 2 % MT SOLN
OROMUCOSAL | Status: AC
  Filled 2017-11-09: qty 15

## 2017-11-09 MED ORDER — MEPERIDINE HCL 100 MG/ML IJ SOLN
INTRAMUSCULAR | Status: AC
  Filled 2017-11-09: qty 2

## 2017-11-09 MED ORDER — STERILE WATER FOR IRRIGATION IR SOLN
Status: DC | PRN
  Administered 2017-11-09: 15 mL

## 2017-11-09 MED ORDER — MIDAZOLAM HCL 5 MG/5ML IJ SOLN
INTRAMUSCULAR | Status: DC | PRN
  Administered 2017-11-09 (×2): 1 mg via INTRAVENOUS
  Administered 2017-11-09: 2 mg via INTRAVENOUS
  Administered 2017-11-09 (×2): 1 mg via INTRAVENOUS

## 2017-11-09 MED ORDER — ONDANSETRON HCL 4 MG/2ML IJ SOLN
INTRAMUSCULAR | Status: AC
  Filled 2017-11-09: qty 2

## 2017-11-09 MED ORDER — ONDANSETRON HCL 4 MG/2ML IJ SOLN
INTRAMUSCULAR | Status: DC | PRN
  Administered 2017-11-09: 4 mg via INTRAVENOUS

## 2017-11-09 MED ORDER — SODIUM CHLORIDE 0.9 % IV SOLN
INTRAVENOUS | Status: DC
  Administered 2017-11-09: 1000 mL via INTRAVENOUS

## 2017-11-09 MED ORDER — MIDAZOLAM HCL 5 MG/5ML IJ SOLN
INTRAMUSCULAR | Status: AC
  Filled 2017-11-09: qty 10

## 2017-11-09 NOTE — H&P (Signed)
@LOGO @   Primary Care Physician:  Redmond School, MD Primary Gastroenterologist:  Dr. Gala Romney  Pre-Procedure History & Physical: HPI:  Leonard Stark is a 67 y.o. male here for for the evaluation of dysphagia. EGD and the average risk screening colocompletely removed   Past Medical History:  Diagnosis Date  . 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  Marland Kitchen Hypercholesterolemia   . Hypertension     Past Surgical History:  Procedure Laterality Date  . APPENDECTOMY  09/2013   Carcinoid tumor, low-grade, 0.4 cm in maximal dimension coming to 0.3 cm of the closest mesenteric resection margin.  Acute appendicitis with perforation on path as well.  Marland Kitchen CARDIAC CATHETERIZATION  2011  . COLONOSCOPY  10/13/2011   benign polyps, poor prep compromised exam, next TCS 7 years.     Prior to Admission medications   Medication Sig Start Date End Date Taking? Authorizing Provider  aspirin EC 81 MG tablet Take 81 mg by mouth every morning.   Yes [provider]  atenolol (TENORMIN) 50 MG tablet Take 50 mg by mouth every morning.   Yes [provider]  Coenzyme Q10 (COQ10) 100 MG CAPS Take 100 mg by mouth daily.    Yes [provider]  linaclotide Rolan Lipa) 290 MCG CAPS capsule Take 1 capsule (290 mcg total) by mouth daily before breakfast. 06/29/17  Yes Mahala Menghini, PA-C  Omega-3 Fatty Acids (FISH OIL) 1000 MG CAPS Take 1,000 mg by mouth 3 (three) times daily.    Yes [provider]  pantoprazole (PROTONIX) 40 MG tablet Take 1 tablet (40 mg total) by mouth daily before breakfast. 06/29/17  Yes Mahala Menghini, PA-C  polyethylene glycol-electrolytes (TRILYTE) 420 g solution Take 4,000 mLs by mouth as directed. 09/03/17  Yes Kyleen Villatoro, Cristopher Estimable, MD  pravastatin (PRAVACHOL) 20 MG tablet Take 20 mg by mouth every morning.   Yes [provider]  valsartan-hydrochlorothiazide (DIOVAN-HCT) 320-25 MG tablet Take 1 tablet by  mouth daily.   Yes [provider]  acetaminophen (TYLENOL) 500 MG tablet Take 1,000 mg by mouth every 6 (six) hours as needed for moderate pain or headache.     [provider]    Allergies as of 09/03/2017  . (No Known Allergies)    Family History  Problem Relation Age of Onset  . Colon cancer Neg Hx     Social History   Socioeconomic History  . Marital status: Divorced    Spouse name: Not on file  . Number of children: Not on file  . Years of education: Not on file  . Highest education level: Not on file  Occupational History  . Not on file  Social Needs  . Financial resource strain: Not on file  . Food insecurity:    Worry: Not on file    Inability: Not on file  . Transportation needs:    Medical: Not on file    Non-medical: Not on file  Tobacco Use  . Smoking status: Former Research scientist (life sciences)  . Smokeless tobacco: Never Used  Substance and Sexual Activity  . Alcohol use: Yes    Alcohol/week: 0.6 oz    Types: 1 Standard drinks or equivalent per week    Comment: every now and then  . Drug use: No  . Sexual activity: Yes  Lifestyle  . Physical activity:    Days per week: Not on file    Minutes per session: Not on file  .  Stress: Not on file  Relationships  . Social connections:    Talks on phone: Not on file    Gets together: Not on file    Attends religious service: Not on file    Active member of club or organization: Not on file    Attends meetings of clubs or organizations: Not on file    Relationship status: Not on file  . Intimate partner violence:    Fear of current or ex partner: Not on file    Emotionally abused: Not on file    Physically abused: Not on file    Forced sexual activity: Not on file  Other Topics Concern  . Not on file  Social History Narrative  . Not on file    Review of Systems: See HPI, otherwise negative ROS  Physical Exam: BP 139/89   Pulse 69   Temp 97.9 F (36.6 C) (Oral)   Resp 13   Ht 6\' 1"  (1.854 m)    Wt 202 lb 3.2 oz (91.7 kg)   SpO2 97%   BMI 26.68 kg/m  General:   Alert,  Well-developed, well-nourished, pleasant and cooperative in NAD Skin:  Intact without significant lesions or rashes. Eyes:  Sclera clear, no icterus.   Conjunctiva pink. Ears:  Normal auditory acuity. Nose:  No deformity, discharge,  or lesions. Mouth:  No deformity or lesions. Neck:  Supple; no masses or thyromegaly. No significant cervical adenopathy. Lungs:  Clear throughout to auscultation.   No wheezes, crackles, or rhonchi. No acute distress. Heart:  Regular rate and rhythm; no murmurs, clicks, rubs,  or gallops. Abdomen: Non-distended, normal bowel sounds.  Soft and nontender without appreciable mass or hepatosplenomegaly.  Pulses:  Normal pulses noted. Extremities:  Without clubbing or edema.  Impression/Plan:  67 year old gentleman liquid dysphagia;  Reflux symptoms well controlled on P Patient also in need of colonoscop  Recommendations:  I have offered the patient EGD and colonoscopy today.  The risks, benefits, limitations, imponderables and alternatives regarding both EGD and colonoscopy have been reviewed with the patient. Questions have been answered. All parties agreeable.   The risks, benefits, limitations, imponderables and alternatives regarding both EGD and colonoscopy have been reviewed with the patient. Questions have been answered. All parties agreeable.      Notice: This dictation was prepared with Dragon dictation along with smaller phrase technology. Any transcriptional errors that result from this process are unintentional and may not be corrected upon review.

## 2017-11-09 NOTE — Op Note (Signed)
[facility] Patient Name: Leonard Stark Procedure Date: 11/09/2017 8:59 AM MRN: [id number] Date of Birth: 1950/10/24 Attending MD: Norvel Richards , MD CSN: [id number] Age: 67 Admit Type: Outpatient Procedure:                Upper GI endoscopy Indications:              Dysphagia Providers:                Norvel Richards, MD, Charlsie Quest. Theda Sers RN, RN,                            Nelma Rothman, Technician Referring MD:              Medicines:                Midazolam 4 mg IV, Meperidine 75 mg IV Complications:            No immediate complications. Estimated Blood Loss:     Estimated blood loss: none. Procedure:                Pre-Anesthesia Assessment:                           - Prior to the procedure, a History and Physical                            was performed, and patient medications and                            allergies were reviewed. The patient's tolerance of                            previous anesthesia was also reviewed. The risks                            and benefits of the procedure and the sedation                            options and risks were discussed with the patient.                            All questions were answered, and informed consent                            was obtained. Prior Anticoagulants: The patient has                            taken no previous anticoagulant or antiplatelet                            agents. ASA Grade Assessment: II - A patient with                            mild systemic disease. After reviewing the risks  and benefits, the patient was deemed in                            satisfactory condition to undergo the procedure.                           After obtaining informed consent, the endoscope was                            passed under direct vision. Throughout the                            procedure, the patient's blood pressure, pulse, and                            oxygen  saturations were monitored continuously. The                            [id number] ([id number]) scope was introduced through the                            mouth, and advanced to the second part of duodenum.                            The upper GI endoscopy was accomplished without                            difficulty. The patient tolerated the procedure                            well. Scope In: 9:16:11 AM Scope Out: 9:22:48 AM Total Procedure Duration: 0 hours 6 minutes 37 seconds  Findings:      The examined esophagus was normal.      A small hiatal hernia was present.      The exam was otherwise without abnormality.      The duodenal bulb and second portion of the duodenum were normal. The       scope was withdrawn. Dilation was performed with a Maloney dilator with       mild resistance at 56 Fr. The dilation site was examined following       endoscope reinsertion and showed no change. Estimated blood loss: none. Impression:               - Normal esophagus. Dilated.                           - Small hiatal hernia.                           - The examination was otherwise normal.                           - Normal duodenal bulb and second portion of the  duodenum.                           - No specimens collected. Moderate Sedation:      Moderate (conscious) sedation was administered by the endoscopy nurse       and supervised by the endoscopist. The following parameters were       monitored: oxygen saturation, heart rate, blood pressure, respiratory       rate, EKG, adequacy of pulmonary ventilation, and response to care.       Total physician intraservice time was 12 minutes. Recommendation:           - Patient has a contact number available for                            emergencies. The signs and symptoms of potential                            delayed complications were discussed with the                            patient. Return to normal activities  tomorrow.                            Written discharge instructions were provided to the                            patient.                           - Resume previous diet.                           - Continue present medications. If dysphagia                            symptoms persist, would consider high resolution                            manometry as the next step in evaluation. See                            colonoscopy report.                           - No repeat upper endoscopy.                           - Return to GI office in 6 weeks. Procedure Code(s):        --- Professional ---                           [phone], Esophagogastroduodenoscopy, flexible,                            transoral; diagnostic, including collection of  specimen(s) by brushing or washing, when performed                            (separate procedure)                           43450, Dilation of esophagus, by unguided sound or                            bougie, single or multiple passes                           G0500, Moderate sedation services provided by the                            same physician or other qualified health care                            professional performing a gastrointestinal                            endoscopic service that sedation supports,                            requiring the presence of an independent trained                            observer to assist in the monitoring of the                            patient's level of consciousness and physiological                            status; initial 15 minutes of intra-service time;                            patient age 55 years or older (additional time may                            be reported with [phone], as appropriate) Diagnosis Code(s):        --- Professional ---                           K44.9, Diaphragmatic hernia without obstruction or                            gangrene                            R13.10, Dysphagia, unspecified CPT copyright 2017 American [employer] Association. All rights reserved. The codes documented in this report are preliminary and upon coder review may  be revised to meet current compliance requirements. Cristopher Estimable. Kennedy Bohanon, MD Norvel Richards, MD 11/09/2017 9:27:45 AM This report has been signed electronically. Number of Addenda: 0

## 2017-11-09 NOTE — Discharge Instructions (Signed)
°Colonoscopy °Discharge Instructions ° °Read the instructions outlined below and refer to this sheet in the next few weeks. These discharge instructions provide you with general information on caring for yourself after you leave the hospital. Your doctor may also give you specific instructions. While your treatment has been planned according to the most current medical practices available, unavoidable complications occasionally occur. If you have any problems or questions after discharge, call Dr. Rourk at [phone]. °ACTIVITY °· You may resume your regular activity, but move at a slower pace for the next 24 hours.  °· Take frequent rest periods for the next 24 hours.  °· Walking will help get rid of the air and reduce the bloated feeling in your belly (abdomen).  °· No driving for 24 hours (because of the medicine (anesthesia) used during the test).   °· Do not sign any important legal documents or operate any machinery for 24 hours (because of the anesthesia used during the test).  °NUTRITION °· Drink plenty of fluids.  °· You may resume your normal diet as instructed by your doctor.  °· Begin with a light meal and progress to your normal diet. Heavy or fried foods are harder to digest and may make you feel sick to your stomach (nauseated).  °· Avoid alcoholic beverages for 24 hours or as instructed.  °MEDICATIONS °· You may resume your normal medications unless your doctor tells you otherwise.  °WHAT YOU CAN EXPECT TODAY °· Some feelings of bloating in the abdomen.  °· Passage of more gas than usual.  °· Spotting of blood in your stool or on the toilet paper.  °IF YOU HAD POLYPS REMOVED DURING THE COLONOSCOPY: °· No aspirin products for 7 days or as instructed.  °· No alcohol for 7 days or as instructed.  °· Eat a soft diet for the next 24 hours.  °FINDING OUT THE RESULTS OF YOUR TEST °Not all test results are available during your visit. If your test results are not back during the visit, make an appointment  with your caregiver to find out the results. Do not assume everything is normal if you have not heard from your caregiver or the medical facility. It is important for you to follow up on all of your test results.  °SEEK IMMEDIATE MEDICAL ATTENTION IF: °· You have more than a spotting of blood in your stool.  °· Your belly is swollen (abdominal distention).  °· You are nauseated or vomiting.  °· You have a temperature over 101.  °· You have abdominal pain or discomfort that is severe or gets worse throughout the day.  °EGD °Discharge instructions °Please read the instructions outlined below and refer to this sheet in the next few weeks. These discharge instructions provide you with general information on caring for yourself after you leave the hospital. Your doctor may also give you specific instructions. While your treatment has been planned according to the most current medical practices available, unavoidable complications occasionally occur. If you have any problems or questions after discharge, please call your doctor. °ACTIVITY °· You may resume your regular activity but move at a slower pace for the next 24 hours.  °· Take frequent rest periods for the next 24 hours.  °· Walking will help expel (get rid of) the air and reduce the bloated feeling in your abdomen.  °· No driving for 24 hours (because of the anesthesia (medicine) used during the test).  °· You may shower.  °· Do not sign any important   legal documents or operate any machinery for 24 hours (because of the anesthesia used during the test).  NUTRITION  Drink plenty of fluids.   You may resume your normal diet.   Begin with a light meal and progress to your normal diet.   Avoid alcoholic beverages for 24 hours or as instructed by your caregiver.  MEDICATIONS  You may resume your normal medications unless your caregiver tells you otherwise.  WHAT YOU CAN EXPECT TODAY  You may experience abdominal discomfort such as a feeling of fullness  or gas pains.  FOLLOW-UP  Your doctor will discuss the results of your test with you.  SEEK IMMEDIATE MEDICAL ATTENTION IF ANY OF THE FOLLOWING OCCUR:  Excessive nausea (feeling sick to your stomach) and/or vomiting.   Severe abdominal pain and distention (swelling).   Trouble swallowing.   Temperature over 101 F (37.8 C).   Rectal bleeding or vomiting of blood.    Continue present medications.  With more screening colonoscopy in 10 years  Office visit with [place] in 6 weeks

## 2017-11-09 NOTE — Op Note (Signed)
[facility] Patient Name: Leonard Stark Procedure Date: 11/09/2017 9:26 AM MRN: [id number] Date of Birth: 01-Sep-1950 Attending MD: Norvel Richards , MD CSN: [id number] Age: 67 Admit Type: Outpatient Procedure:                Colonoscopy Indications:              Screening for colorectal malignant neoplasm Providers:                Norvel Richards, MD, Charlsie Quest. Theda Sers RN, RN,                            Nelma Rothman, Technician Referring MD:              Medicines:                Midazolam 6 mg IV, Meperidine 75 mg IV Complications:            No immediate complications. Estimated Blood Loss:     Estimated blood loss: none. Procedure:                Pre-Anesthesia Assessment:                           - Prior to the procedure, a History and Physical                            was performed, and patient medications and                            allergies were reviewed. The patient's tolerance of                            previous anesthesia was also reviewed. The risks                            and benefits of the procedure and the sedation                            options and risks were discussed with the patient.                            All questions were answered, and informed consent                            was obtained. Prior Anticoagulants: The patient has                            taken no previous anticoagulant or antiplatelet                            agents. ASA Grade Assessment: II - A patient with                            mild systemic disease. After reviewing the risks  and benefits, the patient was deemed in                            satisfactory condition to undergo the procedure.                           After obtaining informed consent, the colonoscope                            was passed under direct vision. Throughout the                            procedure, the patient's blood pressure, pulse, and               oxygen saturations were monitored continuously. The                            EC-[id number] ([id number]) scope was introduced through                            the anus and advanced to the the cecum, identified                            by appendiceal orifice and ileocecal valve. The                            colonoscopy was performed without difficulty. The                            patient tolerated the procedure well. The quality                            of the bowel preparation was adequate. The                            ileocecal valve, appendiceal orifice, and rectum                            were photographed. The entire colon was well                            visualized. The quality of the bowel preparation                            was adequate. Scope In: 9:28:53 AM Scope Out: 9:53:31 AM Scope Withdrawal Time: 0 hours 9 minutes 17 seconds  Total Procedure Duration: 0 hours 24 minutes 38 seconds  Findings:      The perianal and digital rectal examinations were normal.      The colon (entire examined portion) was tortuous. external abdominal       pressure and changing the patient's position required to reach the       cecum. 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      The exam was otherwise without abnormality on direct and retroflexion  views. Impression:               - Tortuous colon. Melanosis coli                           - The examination was otherwise normal on direct                            and retroflexion views.                           - No specimens collected. Moderate Sedation:      Moderate (conscious) sedation was administered by the endoscopy nurse       and supervised by the endoscopist. The following parameters were       monitored: oxygen saturation, heart rate, blood pressure, respiratory       rate, EKG, adequacy of pulmonary ventilation, and response to care.       Total physician intraservice time was 31  minutes. Recommendation:           - Patient has a contact number available for                            emergencies. The signs and symptoms of potential                            delayed complications were discussed with the                            patient. Return to normal activities tomorrow.                            Written discharge instructions were provided to the                            patient.                           - Advance diet as tolerated.                           - Continue present medications.                           - Repeat colonoscopy in 10 years for screening                            purposes.                           - Return to GI office in 6 weeks. See EGD report. Procedure Code(s):        --- Professional ---                           [phone], Colonoscopy, flexible; diagnostic, including  collection of specimen(s) by brushing or washing,                            when performed (separate procedure)                           G0500, Moderate sedation services provided by the                            same physician or other qualified health care                            professional performing a gastrointestinal                            endoscopic service that sedation supports,                            requiring the presence of an independent trained                            observer to assist in the monitoring of the                            patient's level of consciousness and physiological                            status; initial 15 minutes of intra-service time;                            patient age 62 years or older (additional time may                            be reported with [phone], as appropriate)                           [phone], Moderate sedation services provided by the                            same physician or other qualified health care                            professional performing the  diagnostic or                            therapeutic service that the sedation supports,                            requiring the presence of an independent trained                            observer to assist in the monitoring of the  patient's level of consciousness and physiological                            status; each additional 15 minutes intraservice                            time (List separately in addition to code for                            primary service) Diagnosis Code(s):        --- Professional ---                           Z12.11, Encounter for screening for malignant                            neoplasm of colon                           Q43.8, Other specified congenital malformations of                            intestine CPT copyright 2017 [employer]. All rights reserved. The codes documented in this report are preliminary and upon coder review may  be revised to meet current compliance requirements. Cristopher Estimable. Shealeigh Dunstan, MD Norvel Richards, MD 11/09/2017 9:57:48 AM This report has been signed electronically. Number of Addenda: 0

## 2017-11-14 ENCOUNTER — Encounter (HOSPITAL_COMMUNITY): Payer: Self-pay | Admitting: Internal Medicine

## 2018-01-14 ENCOUNTER — Other Ambulatory Visit: Payer: Self-pay | Admitting: *Deleted

## 2018-01-14 NOTE — Telephone Encounter (Signed)
Rx request for pantoprazole 40 mg for [facility] day supply [facility]

## 2018-01-15 MED ORDER — PANTOPRAZOLE SODIUM 40 MG PO TBEC: 40.0000 mg | DELAYED_RELEASE_TABLET | Freq: Every day | ORAL | 3 refills | Status: DC

## 2018-01-15 NOTE — Addendum Note (Signed)
Addended by: Mahala Menghini on: 01/15/2018 02:26 PM   Modules accepted: Orders

## 2018-02-05 ENCOUNTER — Ambulatory Visit: Payer: Managed Care, Other (non HMO) | Admitting: Gastroenterology

## 2018-02-05 ENCOUNTER — Telehealth: Payer: Self-pay | Admitting: Gastroenterology

## 2018-02-05 ENCOUNTER — Encounter: Payer: Self-pay | Admitting: *Deleted

## 2018-02-05 ENCOUNTER — Encounter: Payer: Self-pay | Admitting: Gastroenterology

## 2018-02-05 VITALS — BP 128/84 | HR 62 | Temp 97.1°F | Ht 73.0 in | Wt 206.2 lb

## 2018-02-05 DIAGNOSIS — R1310 Dysphagia, unspecified: Secondary | ICD-10-CM

## 2018-02-05 DIAGNOSIS — R1319 Other dysphagia: Secondary | ICD-10-CM

## 2018-02-05 DIAGNOSIS — K5900 Constipation, unspecified: Secondary | ICD-10-CM | POA: Diagnosis not present

## 2018-02-05 DIAGNOSIS — K219 Gastro-esophageal reflux disease without esophagitis: Secondary | ICD-10-CM

## 2018-02-05 MED ORDER — PANTOPRAZOLE SODIUM 40 MG PO TBEC: 40.0000 mg | DELAYED_RELEASE_TABLET | Freq: Two times a day (BID) | ORAL | 0 refills | Status: DC

## 2018-02-05 NOTE — Patient Instructions (Signed)
1. Continue Linzess 166mcg daily as needed for constipation. We will try to figure out which medication is well covered by your insurance. Further recommendations to follow.  2. Xray of your esophagus as scheduled.  3. Increase pantoprazole to 40mg  twice daily before a meal for at least the next 1 months. You may continue longer if you find beneficial otherwise you may go back to once daily after four weeks.  4. Return to the office for follow up in six months.

## 2018-02-05 NOTE — Assessment & Plan Note (Signed)
Refractory symptoms mostly after meals and at bedtime.  Increase pantoprazole to 40 mg twice daily for at least 1 month.  If no significant change then he can go back to once daily dosing however if significant improvement he continued twice daily for now.  Return to the office in 6 months.  Sooner if needed

## 2018-02-05 NOTE — Telephone Encounter (Signed)
Can we find out what is on his formulary for constipation?   Linzess works well but not sure if covered.

## 2018-02-05 NOTE — Progress Notes (Signed)
Primary Care Physician: Redmond School, MD  Primary Gastroenterologist:  Garfield Cornea, MD   Chief Complaint  Patient presents with  . Dysphagia    pp f/u, sometimes hard for food to go down  . Constipation    HPI: Leonard Stark is a 67 y.o. male here for follow-up of constipation, GERD, dysphagia.  He had a EGD and colonoscopy back in June.  He had a small hiatal hernia but otherwise unremarkable studies.  Esophagus was dilated for history of dysphagia.  Patient reports esophageal dilation did not help.  He continues to have problems swallowing, mostly with pills and solids.  Feels like acid in the chest and feels like a ball in the left neck.  Some heartburn after meals, sometimes nocturnal reflux especially if he eats after 6 PM.  When these episodes occur he is to sit up the rest of the night.  No vomiting.  Constipation managed with Linzess however he is only taking it once every 1 to 2 weeks because he is using samples only.  He said pharmacy quoted him over $300 for 1 month supply, he could not afford it.  No rectal bleeding.  No melena.    Current Outpatient Medications  Medication Sig Dispense Refill  . acetaminophen (TYLENOL) 500 MG tablet Take 1,000 mg by mouth every 6 (six) hours as needed for moderate pain or headache.     Marland Kitchen aspirin EC 81 MG tablet Take 81 mg by mouth every morning.    Marland Kitchen atenolol (TENORMIN) 50 MG tablet Take 50 mg by mouth every morning.    . Coenzyme Q10 (COQ10) 100 MG CAPS Take 100 mg by mouth daily.     Marland Kitchen linaclotide (LINZESS) 290 MCG CAPS capsule Take 1 capsule (290 mcg total) by mouth daily before breakfast. 30 capsule 11  . Omega-3 Fatty Acids (FISH OIL) 1000 MG CAPS Take 1,000 mg by mouth 3 (three) times daily.     . pantoprazole (PROTONIX) 40 MG tablet Take 1 tablet (40 mg total) by mouth daily before breakfast. 90 tablet 3  . pravastatin (PRAVACHOL) 20 MG tablet Take 20 mg by mouth every morning.    . valsartan-hydrochlorothiazide  (DIOVAN-HCT) 320-25 MG tablet Take 1 tablet by mouth daily.     No current facility-administered medications for this visit.     Allergies as of 02/05/2018  . (No Known Allergies)    ROS:  General: Negative for anorexia, weight loss, fever, chills, fatigue, weakness. ENT: Negative for hoarseness, difficulty swallowing , nasal congestion. CV: Negative for chest pain, angina, palpitations, dyspnea on exertion, peripheral edema.  Respiratory: Negative for dyspnea at rest, dyspnea on exertion, cough, sputum, wheezing.  GI: See history of present illness. GU:  Negative for dysuria, hematuria, urinary incontinence, urinary frequency, nocturnal urination.  Endo: Negative for unusual weight change.    Physical Examination:   BP 128/84   Pulse 62   Temp (!) 97.1 F (36.2 C) (Oral)   Ht 6\' 1"  (1.854 m)   Wt 206 lb 3.2 oz (93.5 kg)   BMI 27.20 kg/m   General: Well-nourished, well-developed in no acute distress.  Eyes: No icterus. Mouth: Oropharyngeal mucosa moist and pink , no lesions erythema or exudate. Lungs: Clear to auscultation bilaterally.  Heart: Regular rate and rhythm, no murmurs rubs or gallops.  Abdomen: Bowel sounds are normal, nontender, nondistended, no hepatosplenomegaly or masses, no abdominal bruits or hernia , no rebound or guarding.   Extremities: No lower extremity edema.  No clubbing or deformities. Neuro: Alert and oriented x 4   Skin: Warm and dry, no jaundice.   Psych: Alert and cooperative, normal mood and affect.

## 2018-02-05 NOTE — Assessment & Plan Note (Signed)
Recommend barium esophagram to evaluate for Zenker's, persistent dysphagia.  May ultimately require esophageal manometry.

## 2018-02-05 NOTE — Assessment & Plan Note (Signed)
May go more than 1 week without a bowel movement.  Linzess may not be affordable, we will look into it.  Samples provided for now.

## 2018-02-06 NOTE — Progress Notes (Signed)
CC'D TO PCP °

## 2018-02-07 MED ORDER — LINACLOTIDE 290 MCG PO CAPS: 290.0000 ug | ORAL_CAPSULE | Freq: Every day | ORAL | 3 refills | Status: DC

## 2018-02-07 NOTE — Telephone Encounter (Signed)
Any f/u yet?

## 2018-02-07 NOTE — Telephone Encounter (Signed)
Spoke with Luvenia Starch of [employer] and pt pays 20% of the cost that his insurance company doesn't pay. The Linzess will be $227.41 for a 3 month supply and $90 for a 30 day supply. For Amitiza 3 month supply is $199.99. Pts insurance will pay over $900.00 on both of these medications and pt is left to pay the remainder. Pt is responsible for 20 % of all the medications. Tier exceptions aren't available for these medications since the insurance has paid over 50%. Good Rx aren't good options for these medications either.

## 2018-02-07 NOTE — Telephone Encounter (Signed)
Spoke with [employer] from [employer]. Linzess 290 is covered under pts plan and Amitiza.

## 2018-02-07 NOTE — Telephone Encounter (Addendum)
I'm going to send in RX. He said it was going to cost $300 for Linzess last time. I'm not sure if PA is needed or why it is that much. Please look into.   I sent to [facility]. Last time he tried getting from [facility].   Please update patient.

## 2018-02-07 NOTE — Addendum Note (Signed)
Addended by: Mahala Menghini on: 02/07/2018 01:15 PM   Modules accepted: Orders

## 2018-02-08 NOTE — Telephone Encounter (Signed)
Please update patient regarding his cost so he can call [facility] if he does not want to pay $227 for the 3 month supply I sent in.   He can try Miralax one capful tid until soft stool and then continue once daily to maintain soft stool. This can be bought OTC for about $20 per month.

## 2018-02-11 NOTE — Telephone Encounter (Signed)
Lmom, waiting on a return call.  

## 2018-02-14 NOTE — Telephone Encounter (Signed)
Spoke with pt and [facility] already mailed him the medicine so he is going to take it until he runs out and then start Miralax due to the cost.

## 2018-02-15 ENCOUNTER — Ambulatory Visit (HOSPITAL_COMMUNITY)
Admission: RE | Admit: 2018-02-15 | Discharge: 2018-02-15 | Disposition: A | Discharge status: Home / Self Care | Payer: Managed Care, Other (non HMO) | Source: Ambulatory Visit | Attending: Gastroenterology | Admitting: Gastroenterology

## 2018-02-15 DIAGNOSIS — R1310 Dysphagia, unspecified: Secondary | ICD-10-CM | POA: Diagnosis present

## 2018-02-15 DIAGNOSIS — K219 Gastro-esophageal reflux disease without esophagitis: Secondary | ICD-10-CM | POA: Diagnosis not present

## 2018-02-15 DIAGNOSIS — R1319 Other dysphagia: Secondary | ICD-10-CM

## 2018-02-15 DIAGNOSIS — K5900 Constipation, unspecified: Secondary | ICD-10-CM | POA: Insufficient documentation

## 2018-02-22 ENCOUNTER — Telehealth: Payer: Self-pay | Admitting: Internal Medicine

## 2018-02-22 NOTE — Telephone Encounter (Signed)
Pt was returning a call from AM. I told him she was off today and would be back Monday. Please call [phone]

## 2018-02-25 NOTE — Telephone Encounter (Signed)
Spoke with pt. He was notified of results.

## 2018-04-15 ENCOUNTER — Other Ambulatory Visit: Payer: Self-pay | Admitting: Gastroenterology

## 2018-08-06 ENCOUNTER — Other Ambulatory Visit: Payer: Self-pay

## 2018-08-06 ENCOUNTER — Ambulatory Visit: Payer: Managed Care, Other (non HMO) | Admitting: Gastroenterology

## 2018-08-06 ENCOUNTER — Encounter: Payer: Self-pay | Admitting: Gastroenterology

## 2018-08-06 VITALS — BP 122/83 | HR 53 | Temp 96.6°F | Ht 71.0 in | Wt 209.2 lb

## 2018-08-06 DIAGNOSIS — K5900 Constipation, unspecified: Secondary | ICD-10-CM | POA: Diagnosis not present

## 2018-08-06 DIAGNOSIS — K219 Gastro-esophageal reflux disease without esophagitis: Secondary | ICD-10-CM | POA: Diagnosis not present

## 2018-08-06 NOTE — Patient Instructions (Signed)
1. Continue pantoprazole 40 mg twice daily before meals. 2. Continue Linzess as you are doing, on days you are not working.  We have provided additional samples.  If you run out of medication, you could consider taking MiraLAX 1 capful every day to maintain soft bowel movements. 3. Return to the office in 1 year.

## 2018-08-06 NOTE — Progress Notes (Signed)
Primary Care Physician: Redmond School, MD  Primary Gastroenterologist:  Garfield Cornea, MD   Chief Complaint  Patient presents with  . Dysphagia    doing okay  . Constipation    BM once every 3 weeks. denies blood/straining    HPI: Leonard Stark is a 68 y.o. male here for follow-up.  He has a history of constipation, GERD, dysphagia.  Last seen in September 2019.  EGD and colonoscopy in June 2019, he had a small hiatal hernia but otherwise unremarkable.  Esophagus dilated for history of dysphagia.  On colonoscopy he had a tortuous colon, melanosis coli.  Repeat colonoscopy in 10 years.  After last office visit he had a barium esophagram for persistent dysphagia.  It was unremarkable.  Patient had noted improvement in his symptoms after increasing pantoprazole to twice daily.  Takes Linzess only on the weekends when not working. Adds greens and that helps too.  Bowel movement 1-2 times per week.  Stools are soft, no straining.  No blood in the stool or melena.  Continues to take pantoprazole 40 mg twice daily.  When he has missed a second dose he has had refractory symptoms.  No dysphagia.  No abdominal pain on a regular basis.  Occasionally has some pain in the right lower quadrant, "once in a blue moon".   Current Outpatient Medications  Medication Sig Dispense Refill  . acetaminophen (TYLENOL) 500 MG tablet Take 1,000 mg by mouth every 6 (six) hours as needed for moderate pain or headache.     Marland Kitchen aspirin EC 81 MG tablet Take 81 mg by mouth every morning.    Marland Kitchen atenolol (TENORMIN) 50 MG tablet Take 50 mg by mouth every morning.    . Coenzyme Q10 (COQ10) 100 MG CAPS Take 100 mg by mouth daily.     Marland Kitchen linaclotide (LINZESS) 290 MCG CAPS capsule Take 1 capsule (290 mcg total) by mouth daily before breakfast. (Patient taking differently: Take 290 mcg by mouth. Once a week) 90 capsule 3  . Omega-3 Fatty Acids (FISH OIL) 1000 MG CAPS Take 1,000 mg by mouth 2 (two) times daily.     .  pantoprazole (PROTONIX) 40 MG tablet TAKE 1 TABLET TWICE A DAY BEFORE MEALS 180 tablet 3  . pravastatin (PRAVACHOL) 20 MG tablet Take 20 mg by mouth every morning.    . valsartan-hydrochlorothiazide (DIOVAN-HCT) 320-25 MG tablet Take 1 tablet by mouth daily.     No current facility-administered medications for this visit.     Allergies as of 08/06/2018  . (No Known Allergies)    ROS:  General: Negative for anorexia, weight loss, fever, chills, fatigue, weakness. ENT: Negative for hoarseness, difficulty swallowing , nasal congestion. CV: Negative for chest pain, angina, palpitations, dyspnea on exertion, peripheral edema.  Respiratory: Negative for dyspnea at rest, dyspnea on exertion, cough, sputum, wheezing.  GI: See history of present illness. GU:  Negative for dysuria, hematuria, urinary incontinence, urinary frequency, nocturnal urination.  Endo: Negative for unusual weight change.    Physical Examination:   BP 122/83   Pulse (!) 53   Temp (!) 96.6 F (35.9 C) (Oral)   Ht 5\' 11"  (1.803 m)   Wt 209 lb 3.2 oz (94.9 kg)   BMI 29.18 kg/m   General: Well-nourished, well-developed in no acute distress.  Eyes: No icterus. Mouth: Oropharyngeal mucosa moist and pink , no lesions erythema or exudate. Lungs: Clear to auscultation bilaterally.  Heart: Regular rate and rhythm, no murmurs  rubs or gallops.  Abdomen: Bowel sounds are normal, nontender, nondistended, no hepatosplenomegaly or masses, no abdominal bruits or hernia , no rebound or guarding.   Extremities: No lower extremity edema. No clubbing or deformities. Neuro: Alert and oriented x 4   Skin: Warm and dry, no jaundice.   Psych: Alert and cooperative, normal mood and affect.

## 2018-08-06 NOTE — Assessment & Plan Note (Addendum)
Doing well with current regimen.  If he is unable to obtain further Linzess and he runs out, he will go to MiraLAX 1 capful once daily to maintain soft stools.  Occasional right lower quadrant discomfort, short-lived.  Generally feels fine.  Monitor for more frequent episodes of pain.  At that time would recommend CT.  Return to the office in 1 year or call sooner if needed.

## 2018-08-06 NOTE — Progress Notes (Signed)
cc'ed to pcp °

## 2018-08-06 NOTE — Assessment & Plan Note (Signed)
Doing well.  Continues to require twice daily dosing of pantoprazole.  Return to the office in 1 year or sooner if needed.

## 2019-03-06 DIAGNOSIS — Z1389 Encounter for screening for other disorder: Secondary | ICD-10-CM | POA: Diagnosis not present

## 2019-03-06 DIAGNOSIS — Z23 Encounter for immunization: Secondary | ICD-10-CM | POA: Diagnosis not present

## 2019-03-06 DIAGNOSIS — K219 Gastro-esophageal reflux disease without esophagitis: Secondary | ICD-10-CM | POA: Diagnosis not present

## 2019-03-06 DIAGNOSIS — Z0000 Encounter for general adult medical examination without abnormal findings: Secondary | ICD-10-CM | POA: Diagnosis not present

## 2019-03-06 DIAGNOSIS — E7849 Other hyperlipidemia: Secondary | ICD-10-CM | POA: Diagnosis not present

## 2019-03-06 DIAGNOSIS — I10 Essential (primary) hypertension: Secondary | ICD-10-CM | POA: Diagnosis not present

## 2019-07-07 ENCOUNTER — Encounter: Payer: Self-pay | Admitting: Internal Medicine

## 2019-09-18 DIAGNOSIS — M1991 Primary osteoarthritis, unspecified site: Secondary | ICD-10-CM | POA: Diagnosis not present

## 2019-09-18 DIAGNOSIS — K219 Gastro-esophageal reflux disease without esophagitis: Secondary | ICD-10-CM | POA: Diagnosis not present

## 2019-09-18 DIAGNOSIS — I10 Essential (primary) hypertension: Secondary | ICD-10-CM | POA: Diagnosis not present

## 2019-09-18 DIAGNOSIS — E7849 Other hyperlipidemia: Secondary | ICD-10-CM | POA: Diagnosis not present

## 2019-12-29 DIAGNOSIS — Z0001 Encounter for general adult medical examination with abnormal findings: Secondary | ICD-10-CM | POA: Diagnosis not present

## 2019-12-29 DIAGNOSIS — E782 Mixed hyperlipidemia: Secondary | ICD-10-CM | POA: Diagnosis not present

## 2019-12-29 DIAGNOSIS — Z0000 Encounter for general adult medical examination without abnormal findings: Secondary | ICD-10-CM | POA: Diagnosis not present

## 2019-12-29 DIAGNOSIS — R7309 Other abnormal glucose: Secondary | ICD-10-CM | POA: Diagnosis not present

## 2019-12-29 DIAGNOSIS — E7849 Other hyperlipidemia: Secondary | ICD-10-CM | POA: Diagnosis not present

## 2020-04-25 IMAGING — RF DG ESOPHAGUS
10 of 11 series · 14 of 21 positions shown · non-contrast
Comparison: None.

CLINICAL DATA: Patient presents with symptoms of dysphagia. Patient
feels like food comes back up after eating and/or get stuck in his
throat region.

EXAM:
ESOPHOGRAM / BARIUM SWALLOW / BARIUM TABLET STUDY
TECHNIQUE: Combined double contrast and single contrast examination performed
using effervescent crystals, thick barium liquid, and thin barium
liquid. The patient was observed with fluoroscopy swallowing a 13 mm
barium sulphate tablet.
FLUOROSCOPY TIME:  Fluoroscopy Time:  2 minutes and 6 seconds
Radiation Exposure Index (if provided by the fluoroscopic device):
1,634 micro Gy per meter squared.
Number of Acquired Spot Images: 11

[Series 1: fluoro_barium 2fps_bw · 0.17mm/px · 1 of 2 frames shown (1 of 10)]
[frame 1/2]
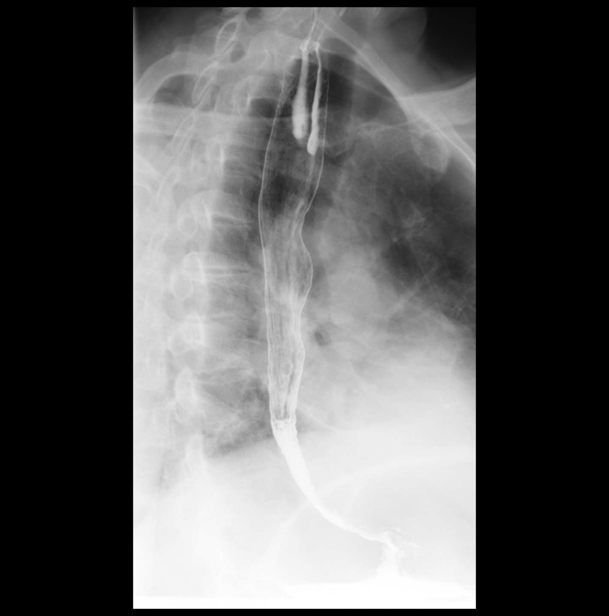

[Series 2: fluoro_barium 2fps_bw · 0.17mm/px · 1 of 1 slices shown (2 of 10)]
[im 1/1]
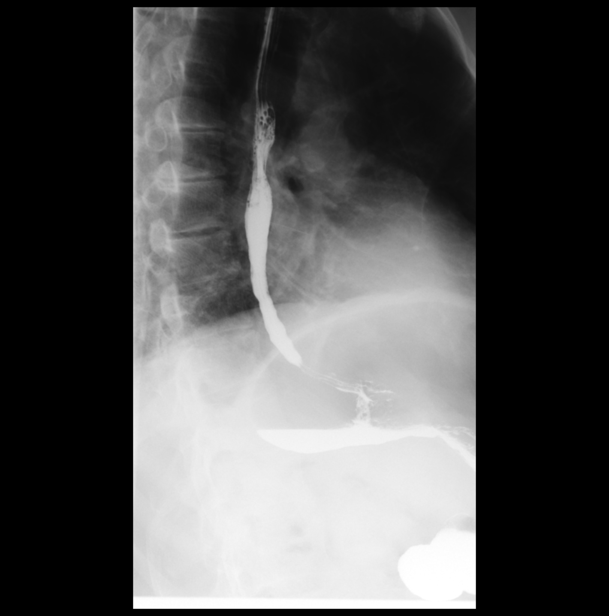

[Series 3: fluoro_barium 2fps_bw · 0.17mm/px · 2 of 3 frames shown (3 of 10)]
[frame 1/3]
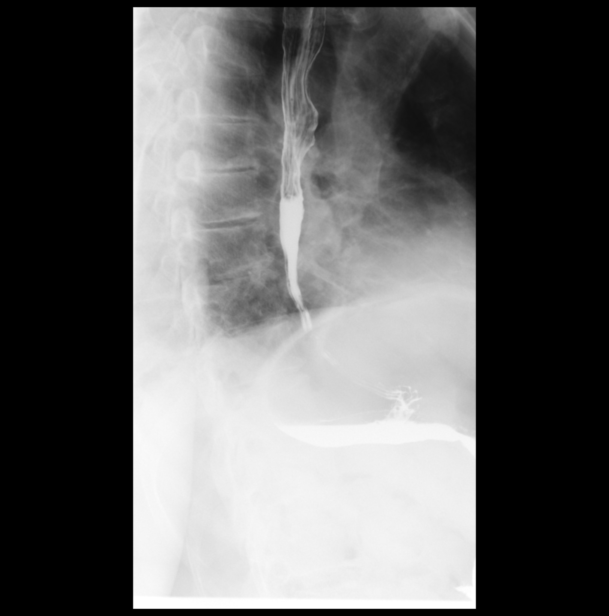
[frame 3/3]
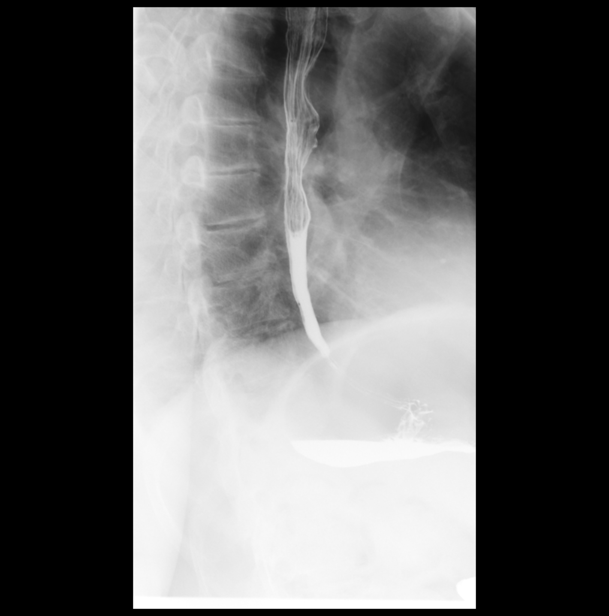

[Series 4: fluoro_barium 2fps_bw · 0.17mm/px · 2 of 4 frames shown (4 of 10)]
[frame 1/4]
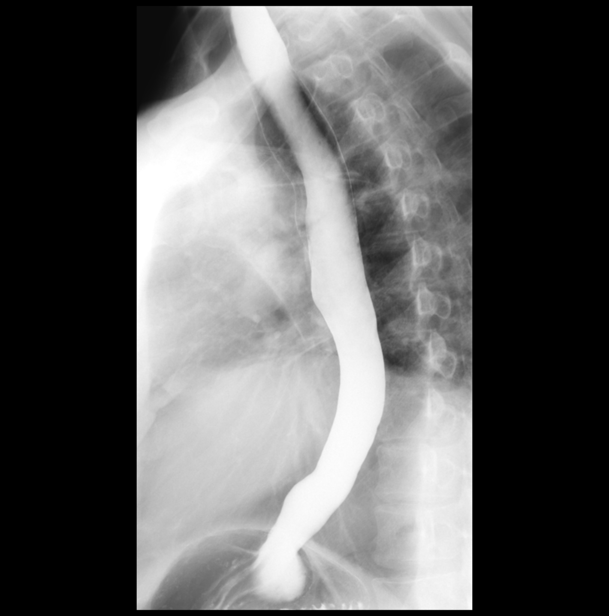
[frame 4/4]
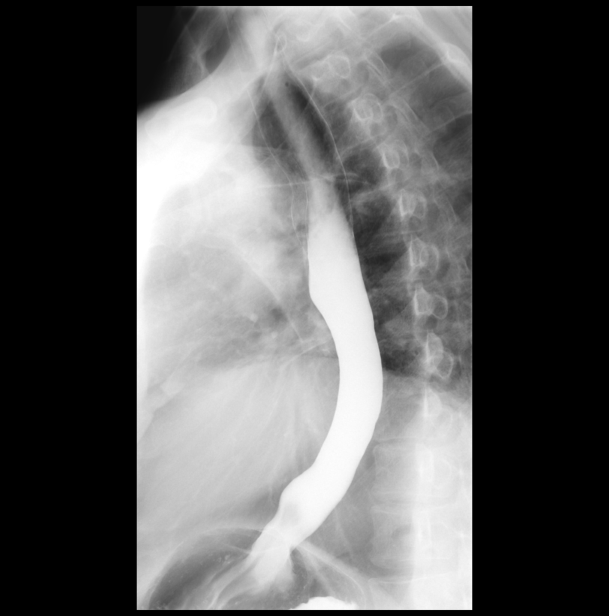

[Series 5: fluoro_barium 2fps_bw · 0.17mm/px · 2 of 8 frames shown (5 of 10)]
[frame 2/8]
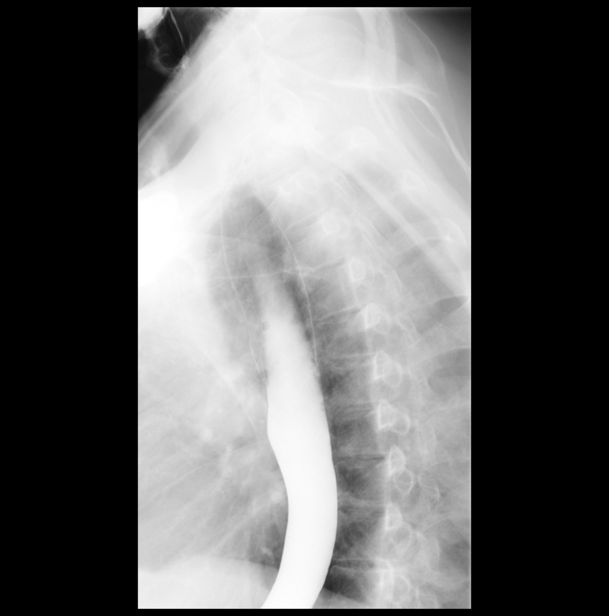
[frame 7/8]
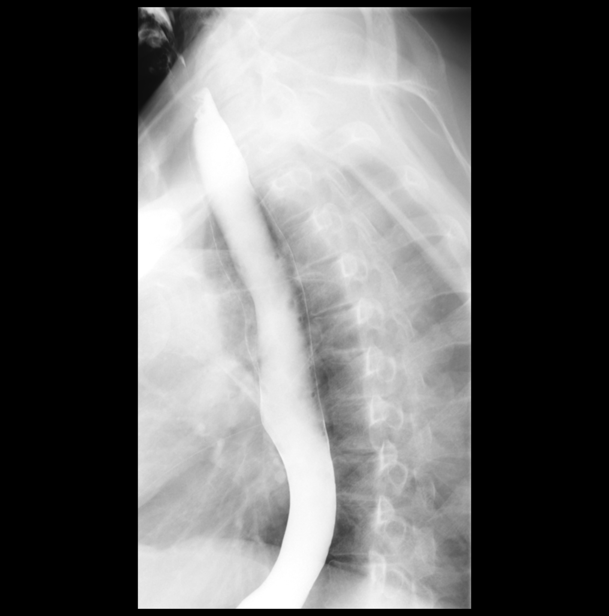

[Series 6: fluoro_barium 2fps_bw · 0.18mm/px · 2 of 3 frames shown (6 of 10)]
[frame 1/3]
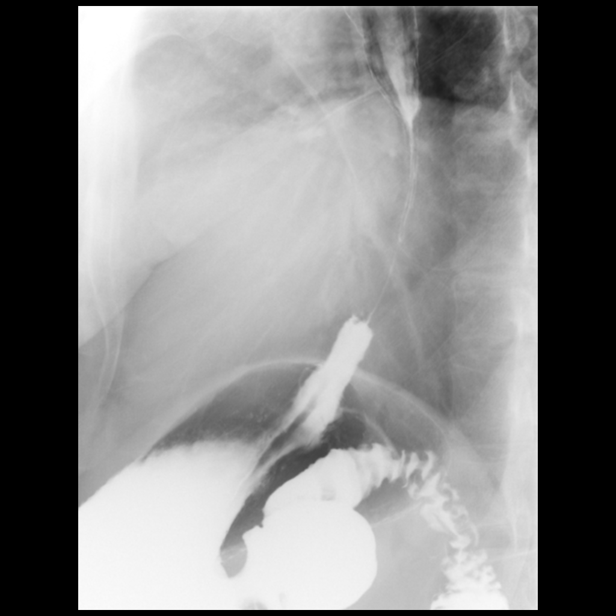
[frame 3/3]
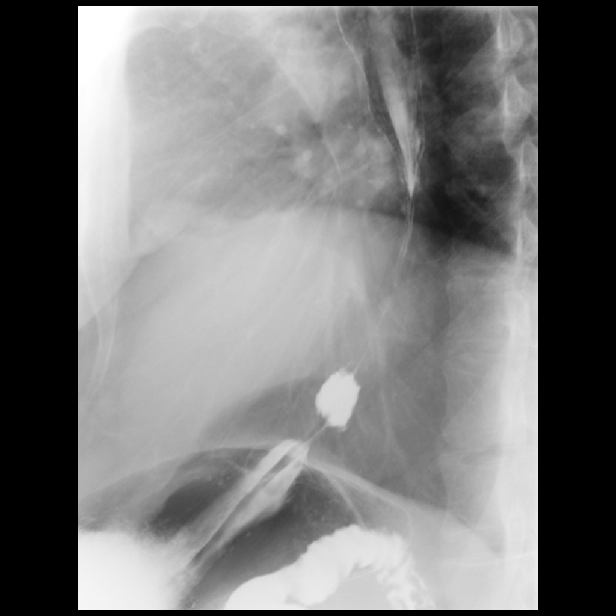

[Series 7: fluoro_barium 2fps_bw · 0.18mm/px · 1 of 1 slices shown (7 of 10)]
[im 1/1]
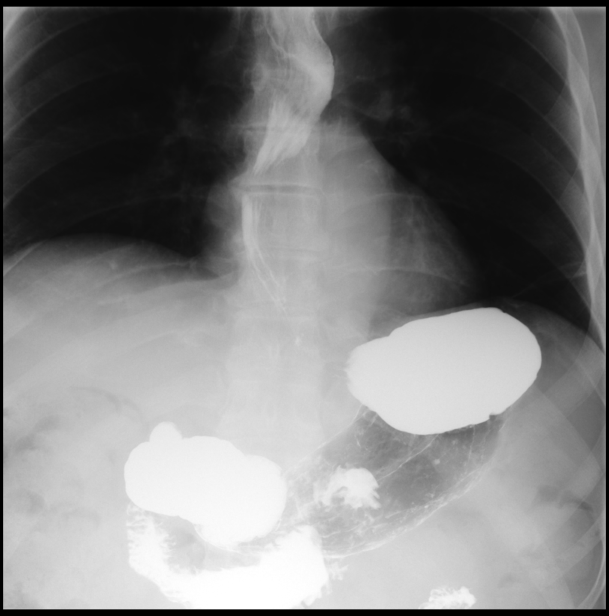

[Series 8: fluoro_barium 2fps_bw · 0.17mm/px · 1 of 2 frames shown (8 of 10)]
[frame 2/2]
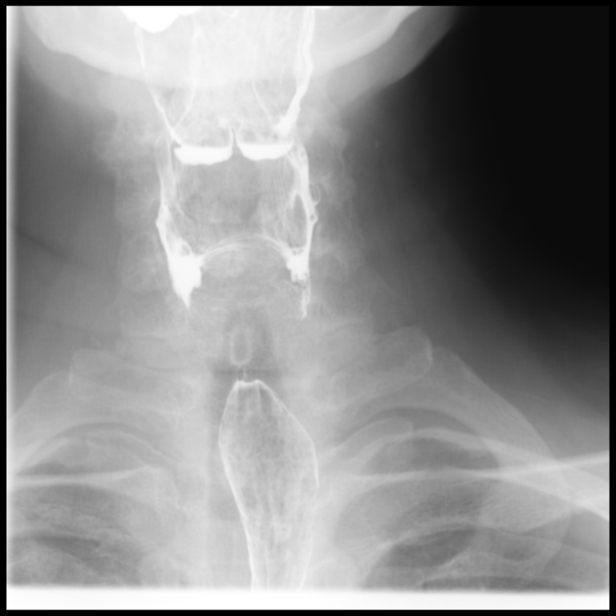

[Series 9: fluoro_barium 2fps_bw · 0.17mm/px · 1 of 1 slices shown (9 of 10)]
[im 1/1]
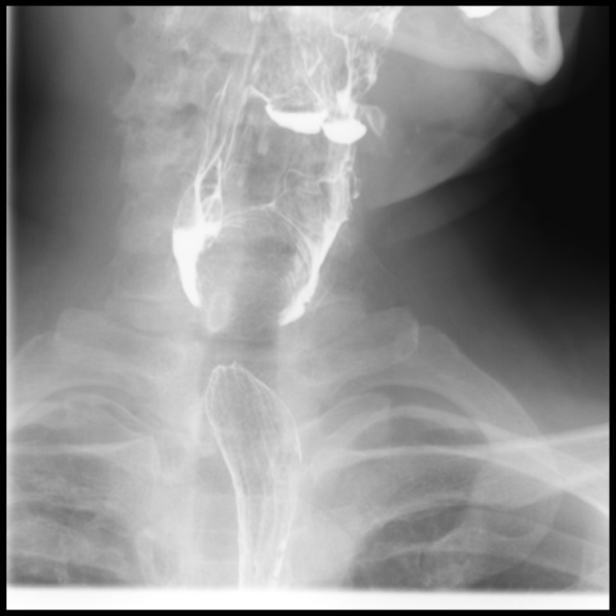

[Series 11: fluoro_barium 2fps_bw · 0.17mm/px · 1 of 1 slices shown (10 of 10)]
[im 1/1]
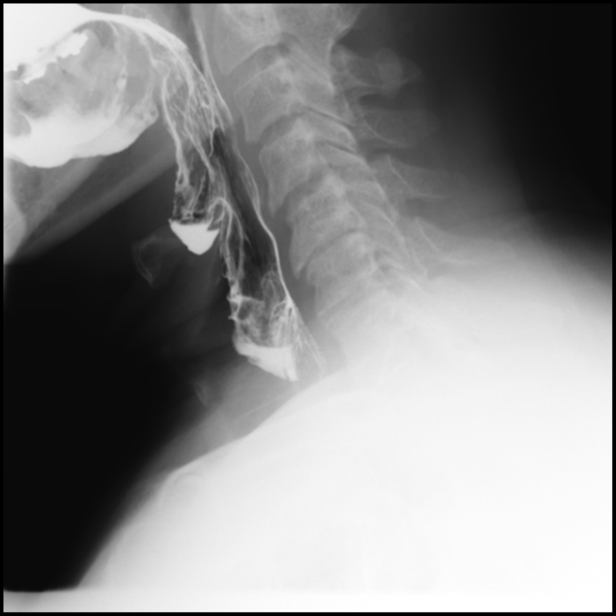

[14 of 21 positions shown; findings below may reference images not displayed]

FINDINGS: Pharyngeal swallowing function is normal.  No pharyngeal mass.

Esophagus is normal in course and in caliber. No esophageal mass. No
narrowing/stricture. No mucosal abnormality.

Motility is normal for age.

No reflux was documented during the exam.

Gastric cardia and fundus are unremarkable.

13 mm barium tablet passed through the esophagus and into the
stomach without holdup.
IMPRESSION: 1. Normal double-contrast barium swallow examination. No esophageal
mass or narrowing. No reflux documented during the exam.

## 2020-09-20 DIAGNOSIS — R109 Unspecified abdominal pain: Secondary | ICD-10-CM | POA: Diagnosis not present

## 2020-09-20 DIAGNOSIS — K625 Hemorrhage of anus and rectum: Secondary | ICD-10-CM | POA: Diagnosis not present

## 2020-09-26 DIAGNOSIS — Z1211 Encounter for screening for malignant neoplasm of colon: Secondary | ICD-10-CM | POA: Diagnosis not present

## 2020-10-07 ENCOUNTER — Encounter: Payer: Self-pay | Admitting: Internal Medicine

## 2020-10-12 DIAGNOSIS — N1831 Chronic kidney disease, stage 3a: Secondary | ICD-10-CM | POA: Diagnosis not present

## 2020-10-12 DIAGNOSIS — Z7289 Other problems related to lifestyle: Secondary | ICD-10-CM | POA: Diagnosis not present

## 2020-10-12 DIAGNOSIS — I129 Hypertensive chronic kidney disease with stage 1 through stage 4 chronic kidney disease, or unspecified chronic kidney disease: Secondary | ICD-10-CM | POA: Diagnosis not present

## 2020-10-12 DIAGNOSIS — Z20822 Contact with and (suspected) exposure to covid-19: Secondary | ICD-10-CM | POA: Diagnosis not present

## 2020-10-12 DIAGNOSIS — Z87442 Personal history of urinary calculi: Secondary | ICD-10-CM | POA: Diagnosis not present

## 2021-01-03 DIAGNOSIS — R079 Chest pain, unspecified: Secondary | ICD-10-CM | POA: Diagnosis not present

## 2021-01-03 DIAGNOSIS — Z0000 Encounter for general adult medical examination without abnormal findings: Secondary | ICD-10-CM | POA: Diagnosis not present

## 2021-01-03 DIAGNOSIS — Z0001 Encounter for general adult medical examination with abnormal findings: Secondary | ICD-10-CM | POA: Diagnosis not present

## 2021-01-03 DIAGNOSIS — R7309 Other abnormal glucose: Secondary | ICD-10-CM | POA: Diagnosis not present

## 2021-01-03 DIAGNOSIS — R9431 Abnormal electrocardiogram [ECG] [EKG]: Secondary | ICD-10-CM | POA: Diagnosis not present

## 2021-01-03 DIAGNOSIS — Z1389 Encounter for screening for other disorder: Secondary | ICD-10-CM | POA: Diagnosis not present

## 2021-01-03 DIAGNOSIS — E782 Mixed hyperlipidemia: Secondary | ICD-10-CM | POA: Diagnosis not present

## 2021-01-03 DIAGNOSIS — Z6824 Body mass index (BMI) 24.0-24.9, adult: Secondary | ICD-10-CM | POA: Diagnosis not present

## 2021-01-19 ENCOUNTER — Encounter: Payer: Self-pay | Admitting: Internal Medicine

## 2021-01-19 ENCOUNTER — Ambulatory Visit: Payer: Managed Care, Other (non HMO) | Admitting: Gastroenterology

## 2021-03-15 DIAGNOSIS — R7309 Other abnormal glucose: Secondary | ICD-10-CM | POA: Diagnosis not present

## 2021-03-15 DIAGNOSIS — U071 COVID-19: Secondary | ICD-10-CM | POA: Diagnosis not present

## 2021-03-30 ENCOUNTER — Encounter: Payer: Self-pay | Admitting: *Deleted

## 2021-04-01 ENCOUNTER — Ambulatory Visit: Payer: Managed Care, Other (non HMO) | Admitting: Cardiology

## 2021-04-05 DIAGNOSIS — R300 Dysuria: Secondary | ICD-10-CM | POA: Diagnosis not present

## 2021-04-05 DIAGNOSIS — R053 Chronic cough: Secondary | ICD-10-CM | POA: Diagnosis not present

## 2021-04-05 DIAGNOSIS — R350 Frequency of micturition: Secondary | ICD-10-CM | POA: Diagnosis not present

## 2021-05-10 ENCOUNTER — Ambulatory Visit: Payer: Managed Care, Other (non HMO) | Admitting: Cardiology

## 2021-06-08 ENCOUNTER — Encounter: Payer: Self-pay | Admitting: Cardiology

## 2021-06-08 NOTE — Progress Notes (Deleted)
Cardiology Office Note  Date: 06/08/2021   ID: Leonard Stark, DOB 12/26/1950, MRN [id number]  PCP:  Redmond School, MD  Cardiologist:  None Electrophysiologist:  None   No chief complaint on file.   History of Present Illness: Leonard Stark is a 71 y.o. male referred for cardiology consultation by Mr. Eston Mould with [facility] for the evaluation of abnormal ECG.  I reviewed the available records.  I personally reviewed the faxed copy of ECG from 01/03/2021 obtained at [facility] which shows a sinus bradycardia with poor R wave progression, rule out old anterior infarct pattern.   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  Acute appendicitis with perforation on path as well.   CARDIAC CATHETERIZATION  2011   COLONOSCOPY  10/13/2011   benign polyps, poor prep compromised exam, next TCS 7 years.    COLONOSCOPY N/A 11/09/2017   Dr. Gala Romney: Tortuous colon, melanosis coli.  Repeat colonoscopy in 10 years.   ESOPHAGOGASTRODUODENOSCOPY N/A 11/09/2017   Dr. Gala Romney: Small hiatal hernia, normal esophagus status post dilation for dysphagia.   MALONEY DILATION N/A 11/09/2017   Procedure: Venia Minks DILATION;  Surgeon: Daneil Dolin, MD;  [facility];  Service: Endoscopy;  Laterality: N/A;    Current Outpatient Medications  Medication Sig Dispense Refill   acetaminophen (TYLENOL) 500 MG tablet Take 1,000 mg by mouth every 6 (six) hours as needed for moderate pain or headache.      aspirin EC 81 MG tablet Take 81 mg by mouth every morning.     atenolol (TENORMIN) 50 MG tablet Take 50 mg by mouth every  morning.     Coenzyme Q10 (COQ10) 100 MG CAPS Take 100 mg by mouth daily.      linaclotide (LINZESS) 290 MCG CAPS capsule Take 1 capsule (290 mcg total) by mouth daily before breakfast. (Patient taking differently: Take 290 mcg by mouth. Once a week) 90 capsule 3   Omega-3 Fatty Acids (FISH OIL) 1000 MG CAPS Take 1,000 mg by mouth 2 (two) times daily.      pantoprazole (PROTONIX) 40 MG tablet TAKE 1 TABLET TWICE A DAY BEFORE MEALS 180 tablet 3   pravastatin (PRAVACHOL) 20 MG tablet Take 20 mg by mouth every morning.     valsartan-hydrochlorothiazide (DIOVAN-HCT) 320-25 MG tablet Take 1 tablet by mouth daily.     No current facility-administered medications for this visit.   Allergies:  Patient has no known allergies.   Social History: The patient  reports that he has quit smoking. He has never used smokeless tobacco. He reports current alcohol use of about 1.0 standard drink per week. He reports that he does not use drugs.   Family History: The patient's family history includes Diabetes in his sister; Hypertension in his sister; Stroke in his mother.   ROS:  Please see the history of present illness. Otherwise, complete review of systems is positive for {NONE DEFAULTED:18576}.  All other systems are reviewed and negative.   Physical Exam: VS:  There were no vitals taken for this visit., BMI There is no height  or weight on file to calculate BMI.  Wt Readings from Last 3 Encounters:  08/06/18 209 lb 3.2 oz (94.9 kg)  02/05/18 206 lb 3.2 oz (93.5 kg)  11/09/17 202 lb 3.2 oz (91.7 kg)    General: Patient appears comfortable at rest. HEENT: Conjunctiva and lids normal, oropharynx clear with moist mucosa. Neck: Supple, no elevated JVP or carotid bruits, no thyromegaly. Lungs: Clear to auscultation, nonlabored breathing at rest. Cardiac: Regular rate and rhythm, no S3 or significant systolic murmur, no pericardial rub. Abdomen: Soft, nontender, no hepatomegaly, bowel sounds present, no  guarding or rebound. Extremities: No pitting edema, distal pulses 2+. Skin: Warm and dry. Musculoskeletal: No kyphosis. Neuropsychiatric: Alert and oriented x3, affect grossly appropriate.  ECG:  An ECG dated 07/09/2012 was personally reviewed today and demonstrated:  Sinus rhythm with PACs and nonspecific ST changes.  Recent Labwork:  August 2021: Cholesterol 159, HDL 37, LDL 96, triglycerides 148 August 2022: Hemoglobin 14.0, platelets 169, BUN 25, creatinine 1.23, potassium 4.0, AST 18, ALT 18, hemoglobin A1c 5.8%, cholesterol 156, triglycerides 97, HDL 41, LDL 97  Other Studies Reviewed Today:  No prior cardiac testing for review today.  Assessment and Plan:    Medication Adjustments/Labs and Tests Ordered: Current medicines are reviewed at length with the patient today.  Concerns regarding medicines are outlined above.   Tests Ordered: No orders of the defined types were placed in this encounter.   Medication Changes: No orders of the defined types were placed in this encounter.   Disposition:  Follow up {follow up:15908}  Signed, Satira Sark, MD, [facility] 06/08/2021 1:25 PM    [place] [facility] [place], [place], [place] [address] Phone: [phone]; Fax: [phone]

## 2021-06-09 ENCOUNTER — Ambulatory Visit: Payer: Managed Care, Other (non HMO) | Admitting: Cardiology

## 2021-07-12 DIAGNOSIS — U071 COVID-19: Secondary | ICD-10-CM | POA: Diagnosis not present

## 2021-07-12 DIAGNOSIS — H6123 Impacted cerumen, bilateral: Secondary | ICD-10-CM | POA: Diagnosis not present

## 2021-07-12 DIAGNOSIS — R7309 Other abnormal glucose: Secondary | ICD-10-CM | POA: Diagnosis not present

## 2021-07-12 DIAGNOSIS — Z23 Encounter for immunization: Secondary | ICD-10-CM | POA: Diagnosis not present

## 2021-07-18 ENCOUNTER — Ambulatory Visit: Payer: Managed Care, Other (non HMO) | Admitting: Cardiology

## 2021-08-24 ENCOUNTER — Encounter: Payer: Self-pay | Admitting: Cardiology

## 2021-08-24 ENCOUNTER — Ambulatory Visit: Payer: Medicare Other | Admitting: Cardiology

## 2021-08-24 ENCOUNTER — Encounter: Payer: Self-pay | Admitting: *Deleted

## 2021-08-24 VITALS — BP 100/72 | HR 76 | Ht 73.0 in | Wt 191.2 lb

## 2021-08-24 DIAGNOSIS — E782 Mixed hyperlipidemia: Secondary | ICD-10-CM

## 2021-08-24 DIAGNOSIS — R0601 Orthopnea: Secondary | ICD-10-CM | POA: Diagnosis not present

## 2021-08-24 DIAGNOSIS — R079 Chest pain, unspecified: Secondary | ICD-10-CM | POA: Diagnosis not present

## 2021-08-24 DIAGNOSIS — R0789 Other chest pain: Secondary | ICD-10-CM

## 2021-08-24 DIAGNOSIS — R9431 Abnormal electrocardiogram [ECG] [EKG]: Secondary | ICD-10-CM | POA: Diagnosis not present

## 2021-08-24 DIAGNOSIS — I10 Essential (primary) hypertension: Secondary | ICD-10-CM | POA: Diagnosis not present

## 2021-08-24 DIAGNOSIS — R0609 Other forms of dyspnea: Secondary | ICD-10-CM | POA: Diagnosis not present

## 2021-08-24 DIAGNOSIS — R0602 Shortness of breath: Secondary | ICD-10-CM

## 2021-08-24 NOTE — Progress Notes (Signed)
? ? ?Cardiology Office Note ? ?Date: 08/24/2021  ? ?ID: Leonard Stark, DOB 1950-08-11, MRN [id number] ? ?PCP:  Redmond School, MD  ?Cardiologist:  Rozann Lesches, MD ?Electrophysiologist:  None  ? ?No chief complaint on file. ? ? ?History of Present Illness: ?Leonard Stark is a 71 y.o. male referred for cardiology consultation by Mr. Eston Mould at [facility] for evaluation of abnormal ECG per review of records.  He is here today with his wife.  He tells me that for several years he has been experiencing a sense of chest discomfort and also orthopnea at nighttime.  This has been getting worse over the last several months.  He has a long standing history of irregular heartbeat by report, but no significant arrhythmias.  He does report a sense of dyspnea on exertion and fatigue.  No frank syncope. ? ?I reviewed his medications which are stable and outlined below.  He reports compliance with therapy.  ECG today shows sinus rhythm with poor R wave progression rule out old anterior infarct pattern. ? ?I see that he underwent a cardiac catheterization back in 2011 that showed mild coronary atherosclerosis, nonobstructive and with normal LVEF.  He has not undergone interval cardiac structural or ischemic testing. ? ?Past Medical History:  ?Diagnosis Date  ? Anxiety   ? 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 Found when patient presented with ruptured appendix  ? Essential hypertension   ? GERD (gastroesophageal reflux disease)   ? Mixed hyperlipidemia   ? ? ?Past Surgical History:  ?Procedure Laterality Date  ? APPENDECTOMY  09/2013  ? Carcinoid tumor, low-grade, 0.4 cm in maximal dimension coming to 0.3 cm of the closest mesenteric resection margin.  Acute appendicitis with perforation on path as well.  ? CARDIAC CATHETERIZATION  2011  ? COLONOSCOPY  10/13/2011  ? benign polyps, poor prep compromised exam, next TCS 7 years.   ? COLONOSCOPY N/A 11/09/2017  ? Dr. Gala Romney: Tortuous colon, melanosis coli.  Repeat colonoscopy in 10 years.   ? ESOPHAGOGASTRODUODENOSCOPY N/A 11/09/2017  ? Dr. Gala Romney: Small hiatal hernia, normal esophagus status post dilation for dysphagia.  ? MALONEY DILATION N/A 11/09/2017  ? Procedure: MALONEY DILATION;  Surgeon: Daneil Dolin, MD;  [facility];  Service: Endoscopy;  Laterality: N/A;  ? ? ?Current Outpatient Medications  ?Medication Sig Dispense Refill  ? acetaminophen (TYLENOL) 500 MG tablet Take 1,000 mg by mouth every 6 (six) hours as needed for moderate pain or headache.     ? aspirin EC 81 MG tablet Take 81 mg by mouth every morning.    ? atenolol (TENORMIN) 50 MG tablet Take 50 mg by mouth every morning.    ? Coenzyme Q10 (COQ10) 100 MG CAPS Take 100 mg by mouth daily.     ? Omega-3 Fatty Acids (FISH OIL) 1000 MG CAPS Take 1,000 mg by mouth 2 (two) times daily.    ? pantoprazole (PROTONIX) 40 MG tablet TAKE 1 TABLET TWICE A DAY BEFORE MEALS 180 tablet 3  ? pravastatin (PRAVACHOL) 20 MG tablet Take 20 mg by mouth every morning.    ? valsartan-hydrochlorothiazide (DIOVAN-HCT) 320-25 MG tablet Take 1 tablet by mouth daily.    ? ?No current facility-administered medications for this visit.  ? ?Allergies:  Patient has no known allergies.  ? ?Social History: The patient  reports that he has quit smoking. He has never used smokeless tobacco. He reports current alcohol use of about 1.0 standard drink per week. He reports that  he does not use drugs.  ? ?Family History: The patient's family history includes Diabetes in his sister; Hypertension in his sister; Stroke in his mother.  ? ?ROS: No claudication. ? ?Physical Exam: ?VS:  BP 100/72   Pulse 76   Ht '6\' 1"'$  (1.854 m)   Wt 191 lb 3.2 oz (86.7 kg)   SpO2 95%   BMI 25.23 kg/m? , BMI Body mass index is 25.23 kg/m?. ? ?Wt Readings from Last 3 Encounters:  ?08/24/21 191 lb 3.2 oz (86.7 kg)  ?08/06/18 209 lb 3.2 oz (94.9 kg)  ?02/05/18 206 lb 3.2 oz (93.5 kg)  ?  ?General: Patient appears comfortable at rest. ?HEENT: Conjunctiva and lids normal, oropharynx  clear. ?Neck: Supple, no elevated JVP or carotid bruits, no thyromegaly. ?Lungs: Clear to auscultation, nonlabored breathing at rest. ?Cardiac: Regular rate and rhythm, no S3 or significant systolic murmur, no pericardial rub. ?Abdomen: Soft, nontender, bowel sounds present. ?Extremities: No pitting edema, distal pulses 2+. ?Skin: Warm and dry. ?Musculoskeletal: No kyphosis. ?Neuropsychiatric: Alert and oriented x3, affect grossly appropriate. ? ?ECG:  An ECG dated 01/03/2021 was personally reviewed today and demonstrated:  Sinus bradycardia with poor R wave progression, rule out old anterior infarct pattern. ? ?Recent Labwork: ? ?August 2021: Cholesterol 159, HDL 37, LDL 96, glycerides 148 ?August 2022: Hemoglobin 14.0, platelets 169, BUN 25, creatinine 1.23, potassium 4.0, AST 18, ALT 18, hemoglobin A1c 5.8%, cholesterol 156, triglycerides 97, HDL 41, LDL 97 ? ?Other Studies Reviewed Today: ? ?No recent cardiac testing for review. ? ?Assessment and Plan: ? ?1.  Recurrent chest discomfort, dyspnea exertion, and also orthopnea in a 71 year old male with history of hypertension and hyperlipidemia.  ECG is abnormal showing poor R wave progression rule out old anterior infarct pattern.  He did undergo cardiac catheterization in 2011 that showed only mild coronary atherosclerosis.  Plan is to obtain an echocardiogram to assess cardiac structure and function, also a Lexiscan Myoview for ischemic evaluation. ? ?2.  Reported longstanding history of irregular heartbeat without significant arrhythmia.  ECG shows some normal sinus rhythm and his heart rate is regular today during examination.  This is not specifically associated with the above-mentioned symptoms. ? ?3.  Essential hypertension, currently on atenolol and Diovan HCT.  Blood pressure low normal today. ? ?4.  Hyperlipidemia on Pravachol.  Last LDL 97. ? ?Medication Adjustments/Labs and Tests Ordered: ?Current medicines are reviewed at length with the patient today.   Concerns regarding medicines are outlined above.  ? ?Tests Ordered: ?Orders Placed This Encounter  ?Procedures  ? NM Myocar Multi W/Spect W/Wall Motion / EF  ? EKG 12-Lead  ? ECHOCARDIOGRAM COMPLETE  ? ? ?Medication Changes: ?No orders of the defined types were placed in this encounter. ? ? ?Disposition:  Follow up  test results. ? ?Signed, ?Satira Sark, MD, [facility] ?08/24/2021 3:19 PM    ?[facility] ?[place], [place], [place] [address] ?Phone: [phone]; Fax: [phone]  ?

## 2021-08-24 NOTE — Patient Instructions (Signed)
Medication Instructions:  Your physician recommends that you continue on your current medications as directed. Please refer to the Current Medication list given to you today.  Labwork: none  Testing/Procedures: Your physician has requested that you have an echocardiogram. Echocardiography is a painless test that uses sound waves to create images of your heart. It provides your doctor with information about the size and shape of your heart and how well your heart's chambers and valves are working. This procedure takes approximately one hour. There are no restrictions for this procedure. Your physician has requested that you have a lexiscan myoview. For further information please visit [email or web address]. Please follow instruction sheet, as given.  Follow-Up: Your physician recommends that you schedule a follow-up appointment in: pending  Any Other Special Instructions Will Be Listed Below (If Applicable).  If you need a refill on your cardiac medications before your next appointment, please call your pharmacy. 

## 2021-08-29 ENCOUNTER — Ambulatory Visit (HOSPITAL_COMMUNITY)
Admission: RE | Admit: 2021-08-29 | Discharge: 2021-08-29 | Disposition: A | Discharge status: Home / Self Care | Payer: Medicare Other | Source: Ambulatory Visit | Attending: Cardiology | Admitting: Cardiology

## 2021-08-29 ENCOUNTER — Encounter (HOSPITAL_COMMUNITY): Payer: Self-pay

## 2021-08-29 ENCOUNTER — Encounter (HOSPITAL_COMMUNITY)
Admission: RE | Admit: 2021-08-29 | Discharge: 2021-08-29 | Disposition: A | Discharge status: Home / Self Care | Payer: Medicare Other | Source: Ambulatory Visit | Attending: Cardiology | Admitting: Cardiology

## 2021-08-29 DIAGNOSIS — R9431 Abnormal electrocardiogram [ECG] [EKG]: Secondary | ICD-10-CM | POA: Diagnosis not present

## 2021-08-29 DIAGNOSIS — R0602 Shortness of breath: Secondary | ICD-10-CM | POA: Insufficient documentation

## 2021-08-29 DIAGNOSIS — R0789 Other chest pain: Secondary | ICD-10-CM | POA: Diagnosis not present

## 2021-08-29 LAB — NM MYOCAR MULTI W/SPECT W/WALL MOTION / EF
LV dias vol: 88 mL (ref 62–150)
LV sys vol: 35 mL
Nuc Stress EF: 60 %
Peak HR: 90 {beats}/min
RATE: 0.3
Rest HR: 50 {beats}/min
Rest Nuclear Isotope Dose: 10.5 mCi
SDS: 1
SRS: 0
SSS: 1
ST Depression (mm): 0 mm
Stress Nuclear Isotope Dose: 30 mCi
TID: 1.01

## 2021-08-29 MED ORDER — REGADENOSON 0.4 MG/5ML IV SOLN
INTRAVENOUS | Status: AC
  Administered 2021-08-29: 0.4 mg via INTRAVENOUS
  Filled 2021-08-29: qty 5

## 2021-08-29 MED ORDER — SODIUM CHLORIDE FLUSH 0.9 % IV SOLN
INTRAVENOUS | Status: AC
  Administered 2021-08-29: 10 mL via INTRAVENOUS
  Filled 2021-08-29: qty 10

## 2021-08-29 MED ORDER — TECHNETIUM TC 99M TETROFOSMIN IV KIT
10.0000 | PACK | Freq: Once | INTRAVENOUS | Status: AC | PRN
  Administered 2021-08-29: 10.51 via INTRAVENOUS

## 2021-08-29 MED ORDER — TECHNETIUM TC 99M TETROFOSMIN IV KIT
30.0000 | PACK | Freq: Once | INTRAVENOUS | Status: AC | PRN
  Administered 2021-08-29: 30 via INTRAVENOUS

## 2021-08-31 ENCOUNTER — Telehealth: Payer: Self-pay | Admitting: Cardiology

## 2021-08-31 NOTE — Telephone Encounter (Signed)
-----   Message from Erma Heritage, [place] sent at 08/29/2021  4:38 PM EDT ----- ?Covering for Dr. Domenic Polite - Please let the patient know his stress test was reassuring with no evidence of prior MI or blockage. Pumping function of his heart was normal. Overall a low-risk study. Please forward a copy of results to Redmond School, MD ?

## 2021-08-31 NOTE — Telephone Encounter (Signed)
Patient informed. Copy sent to PCP °

## 2021-08-31 NOTE — Telephone Encounter (Signed)
Pt returning call regarding test results. Please advise ?

## 2021-09-15 ENCOUNTER — Other Ambulatory Visit: Payer: Medicare Other

## 2021-09-27 ENCOUNTER — Ambulatory Visit (INDEPENDENT_AMBULATORY_CARE_PROVIDER_SITE_OTHER): Payer: Medicare Other

## 2021-09-27 DIAGNOSIS — R9431 Abnormal electrocardiogram [ECG] [EKG]: Secondary | ICD-10-CM | POA: Diagnosis not present

## 2021-09-27 DIAGNOSIS — R0602 Shortness of breath: Secondary | ICD-10-CM | POA: Diagnosis not present

## 2021-09-27 DIAGNOSIS — R0789 Other chest pain: Secondary | ICD-10-CM

## 2021-09-27 LAB — ECHOCARDIOGRAM COMPLETE
AR max vel: 3.11 cm2
AV Area VTI: 3.01 cm2
AV Area mean vel: 3 cm2
AV Mean grad: 3 mmHg
AV Peak grad: 5.2 mmHg
Ao pk vel: 1.14 m/s
Area-P 1/2: 3.37 cm2
Calc EF: 67.8 %
S' Lateral: 2.74 cm
Single Plane A2C EF: 67.5 %
Single Plane A4C EF: 67.3 %

## 2021-10-12 DIAGNOSIS — Z6824 Body mass index (BMI) 24.0-24.9, adult: Secondary | ICD-10-CM | POA: Diagnosis not present

## 2021-10-12 DIAGNOSIS — Z0001 Encounter for general adult medical examination with abnormal findings: Secondary | ICD-10-CM | POA: Diagnosis not present

## 2021-10-12 DIAGNOSIS — E7849 Other hyperlipidemia: Secondary | ICD-10-CM | POA: Diagnosis not present

## 2021-10-12 DIAGNOSIS — R7309 Other abnormal glucose: Secondary | ICD-10-CM | POA: Diagnosis not present

## 2021-10-12 DIAGNOSIS — M1991 Primary osteoarthritis, unspecified site: Secondary | ICD-10-CM | POA: Diagnosis not present

## 2021-10-12 DIAGNOSIS — I10 Essential (primary) hypertension: Secondary | ICD-10-CM | POA: Diagnosis not present

## 2021-10-12 DIAGNOSIS — E782 Mixed hyperlipidemia: Secondary | ICD-10-CM | POA: Diagnosis not present

## 2022-03-15 DIAGNOSIS — E7849 Other hyperlipidemia: Secondary | ICD-10-CM | POA: Diagnosis not present

## 2022-03-15 DIAGNOSIS — R7309 Other abnormal glucose: Secondary | ICD-10-CM | POA: Diagnosis not present

## 2022-03-15 DIAGNOSIS — E782 Mixed hyperlipidemia: Secondary | ICD-10-CM | POA: Diagnosis not present

## 2022-03-15 DIAGNOSIS — Z23 Encounter for immunization: Secondary | ICD-10-CM | POA: Diagnosis not present

## 2022-03-15 DIAGNOSIS — I10 Essential (primary) hypertension: Secondary | ICD-10-CM | POA: Diagnosis not present

## 2022-03-15 DIAGNOSIS — Z6824 Body mass index (BMI) 24.0-24.9, adult: Secondary | ICD-10-CM | POA: Diagnosis not present

## 2022-05-19 DIAGNOSIS — R201 Hypoesthesia of skin: Secondary | ICD-10-CM | POA: Diagnosis not present

## 2022-05-19 DIAGNOSIS — I10 Essential (primary) hypertension: Secondary | ICD-10-CM | POA: Diagnosis not present

## 2022-05-19 DIAGNOSIS — E782 Mixed hyperlipidemia: Secondary | ICD-10-CM | POA: Diagnosis not present

## 2022-09-29 DIAGNOSIS — R1110 Vomiting, unspecified: Secondary | ICD-10-CM | POA: Diagnosis not present

## 2022-10-17 DIAGNOSIS — Z0001 Encounter for general adult medical examination with abnormal findings: Secondary | ICD-10-CM | POA: Diagnosis not present

## 2022-10-17 DIAGNOSIS — R201 Hypoesthesia of skin: Secondary | ICD-10-CM | POA: Diagnosis not present

## 2022-10-17 DIAGNOSIS — I10 Essential (primary) hypertension: Secondary | ICD-10-CM | POA: Diagnosis not present

## 2022-10-17 DIAGNOSIS — E7849 Other hyperlipidemia: Secondary | ICD-10-CM | POA: Diagnosis not present

## 2022-10-17 DIAGNOSIS — Z029 Encounter for administrative examinations, unspecified: Secondary | ICD-10-CM | POA: Diagnosis not present

## 2022-10-17 DIAGNOSIS — R7303 Prediabetes: Secondary | ICD-10-CM | POA: Diagnosis not present

## 2022-12-05 DIAGNOSIS — N289 Disorder of kidney and ureter, unspecified: Secondary | ICD-10-CM | POA: Diagnosis not present

## 2022-12-05 DIAGNOSIS — D649 Anemia, unspecified: Secondary | ICD-10-CM | POA: Diagnosis not present

## 2022-12-07 ENCOUNTER — Encounter: Payer: Self-pay | Admitting: *Deleted

## 2022-12-15 ENCOUNTER — Telehealth: Payer: Self-pay | Admitting: Internal Medicine

## 2022-12-15 NOTE — Telephone Encounter (Signed)
Pt brought by his questionnaire and it is in review.

## 2022-12-21 ENCOUNTER — Telehealth: Payer: Self-pay | Admitting: *Deleted

## 2022-12-21 NOTE — Telephone Encounter (Signed)
  Procedure: Colonoscopy  Height: 6'1 Weight: 194lbs        Have you had a colonoscopy before?  11/09/17, Dr. Jena Gauss  Do you have family history of colon cancer?  no  Do you have a family history of polyps? no  Previous colonoscopy with polyps removed? no  Do you have a history colorectal cancer?   no  Are you diabetic?  no  Do you have a prosthetic or mechanical heart valve? no  Do you have a pacemaker/defibrillator?   no  Have you had endocarditis/atrial fibrillation?  no  Do you use supplemental oxygen/CPAP?  no  Have you had joint replacement within the last 12 months?  no  Do you tend to be constipated or have to use laxatives?  no   Do you have history of alcohol use? If yes, how much and how often.  no  Do you have history or are you using drugs? If yes, what do are you  using?  no  Have you ever had a stroke/heart attack?    Have you ever had a heart or other vascular stent placed,?   Do you take weight loss medication? no  Do you take any blood-thinning medications such as: (Plavix, aspirin, Coumadin, Aggrenox, Brilinta, Xarelto, Eliquis, Pradaxa, Savaysa or Effient)? no  If yes we need the name, milligram, dosage and who is prescribing doctor:               Current Outpatient Medications  Medication Sig Dispense Refill   atenolol (TENORMIN) 50 MG tablet Take 50 mg by mouth every morning.     clonazePAM (KLONOPIN) 0.5 MG tablet Take 0.5 mg by mouth 2 (two) times daily.     meloxicam (MOBIC) 15 MG tablet Take 15 mg by mouth daily.     oxybutynin (DITROPAN XL) 15 MG 24 hr tablet Take 15 mg by mouth daily.     pantoprazole (PROTONIX) 40 MG tablet TAKE 1 TABLET TWICE A DAY BEFORE MEALS 180 tablet 3   pravastatin (PRAVACHOL) 40 MG tablet Take 40 mg by mouth every morning.     valsartan-hydrochlorothiazide (DIOVAN-HCT) 320-25 MG tablet Take 1 tablet by mouth daily.     No current facility-administered medications for this visit.    No Known Allergies

## 2023-01-08 ENCOUNTER — Ambulatory Visit: Payer: Medicare Other | Admitting: Urology

## 2023-01-08 ENCOUNTER — Encounter: Payer: Self-pay | Admitting: Urology

## 2023-01-08 VITALS — BP 133/85 | HR 66

## 2023-01-08 DIAGNOSIS — R972 Elevated prostate specific antigen [PSA]: Secondary | ICD-10-CM | POA: Diagnosis not present

## 2023-01-08 DIAGNOSIS — R3129 Other microscopic hematuria: Secondary | ICD-10-CM

## 2023-01-08 LAB — URINALYSIS, ROUTINE W REFLEX MICROSCOPIC
Bilirubin, UA: NEGATIVE
Glucose, UA: NEGATIVE
Ketones, UA: NEGATIVE
Leukocytes,UA: NEGATIVE
Nitrite, UA: NEGATIVE
Protein,UA: NEGATIVE
Specific Gravity, UA: 1.015 (ref 1.005–1.030)
Urobilinogen, Ur: 1 mg/dL (ref 0.2–1.0)
pH, UA: 6.5 (ref 5.0–7.5)

## 2023-01-08 LAB — MICROSCOPIC EXAMINATION
Bacteria, UA: NONE SEEN
WBC, UA: NONE SEEN /hpf (ref 0–5)

## 2023-01-08 NOTE — Progress Notes (Unsigned)
01/08/2023 3:07 PM   Leonard Stark 06-Apr-1951 [id number]  Referring provider: Assunta Found, MD [address] [place],  [place] [address]  No chief complaint on file.   HPI:  New pt -   1) PSA elevation - his May 2024 PSA was 7.3 and cr 1.38. No prior PSA or prior prostate biopsy. No FH PCa. No OAC.   2) MH - UA today - Aug 2024 - with 3-10 rbc. No gross hematuria. He has a slower stream but not bothered.   Today, seen for the above.   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  Acute appendicitis with perforation on path as well.   CARDIAC CATHETERIZATION  2011   COLONOSCOPY  10/13/2011   benign polyps, poor prep compromised exam, next TCS 7 years.    COLONOSCOPY N/A 11/09/2017   Dr. Jena Gauss: Tortuous colon, melanosis coli.  Repeat colonoscopy in 10 years.   ESOPHAGOGASTRODUODENOSCOPY N/A 11/09/2017   Dr. Jena Gauss: Small hiatal hernia, normal esophagus status post dilation for dysphagia.   MALONEY DILATION N/A 11/09/2017   Procedure: Elease Hashimoto DILATION;  Surgeon: Corbin Ade, MD;  [facility];  Service: Endoscopy;  Laterality: N/A;    Home Medications:  Allergies as of 01/08/2023   No Known Allergies      Medication List        Accurate as of January 08, 2023  3:07 PM. If you have any questions, ask your nurse or doctor.          atenolol 50 MG tablet Commonly known as: TENORMIN Take 50 mg by mouth every morning.   clonazePAM 0.5 MG tablet Commonly known as: KLONOPIN Take 0.5 mg by  mouth 2 (two) times daily.   meloxicam 15 MG tablet Commonly known as: MOBIC Take 15 mg by mouth daily.   oxybutynin 15 MG 24 hr tablet Commonly known as: DITROPAN XL Take 15 mg by mouth daily.   pantoprazole 40 MG tablet Commonly known as: PROTONIX TAKE 1 TABLET TWICE A DAY BEFORE MEALS   pravastatin 40 MG tablet Commonly known as: PRAVACHOL Take 40 mg by mouth every morning.   valsartan-hydrochlorothiazide 320-25 MG tablet Commonly known as: DIOVAN-HCT Take 1 tablet by mouth daily.        Allergies: No Known Allergies  Family History: Family History  Problem Relation Age of Onset   Stroke Mother    Hypertension Sister    Diabetes Sister    Colon cancer Neg Hx     Social History:  reports that he has quit smoking. He has never used smokeless tobacco. He reports current alcohol use of about 1.0 standard drink of alcohol per week. He reports that he does not use drugs.   Physical Exam: There were no vitals taken for this visit.  Constitutional:  Alert and oriented, No acute distress. HEENT: [place] AT, moist mucus membranes.  Trachea midline, no masses. Cardiovascular: No clubbing, cyanosis, or edema. Respiratory: Normal respiratory effort, no increased work of breathing.  GI: Abdomen is soft, nontender, nondistended, no abdominal masses GU: No CVA tenderness Lymph: No cervical or inguinal lymphadenopathy. Skin: No rashes, bruises or suspicious lesions. Neurologic: Grossly intact, no focal deficits, moving all 4 extremities. Psychiatric: Normal mood and affect. GU: Penis circumcised, normal foreskin, testicles descended bilaterally and palpably normal, bilateral epididymis palpably normal, scrotum normal DRE: Prostate 40 g, smooth without hard area or nodule   Laboratory Data: Lab Results  Component Value Date   WBC 6.1 12/30/2012   HGB 14.0 12/30/2012   HCT 40.9 12/30/2012   MCV 83.8 12/30/2012   PLT 181 12/30/2012    Lab Results  Component Value Date    CREATININE 1.86 (H) 12/30/2012    No results found for: "PSA"  No results found for: "TESTOSTERONE"  No results found for: "HGBA1C"  Urinalysis    Component Value Date/Time   COLORURINE YELLOW 12/30/2012 1825   APPEARANCEUR CLEAR 12/30/2012 1825   LABSPEC >1.030 (H) 12/30/2012 1825   PHURINE 6.0 12/30/2012 1825   GLUCOSEU NEGATIVE 12/30/2012 1825   HGBUR MODERATE (A) 12/30/2012 1825   BILIRUBINUR NEGATIVE 12/30/2012 1825   KETONESUR NEGATIVE 12/30/2012 1825   PROTEINUR NEGATIVE 12/30/2012 1825   UROBILINOGEN 1.0 12/30/2012 1825   NITRITE NEGATIVE 12/30/2012 1825   LEUKOCYTESUR NEGATIVE 12/30/2012 1825    Lab Results  Component Value Date   BACTERIA RARE 07/09/2012      Assessment & Plan:    1. Elevated PSA I had a long discussion with the patient on the nature of elevated PSA - benign vs malignant causes. We discussed age specific levels and that PCa can be seen on a biopsy with very low PSA levels (<=2.5). We discussed the nature risks and benefits of continued surveillance, other lab tests, imaging as well as prostate biopsy. We discussed the management of prostate cancer might include active surveillance or treatment depending on biopsy findings. All questions answered.  PSA was sent. Will check CT scan which will give [place] an overall assessment of the prostate size and staging.   2. MH - occupational exposure. Plan cystoscopy and CT scan - discussed both and he will proceed.    No follow-ups on file.  Jerilee Field, MD  [facility]  [address] [place], [place], [place] [address] [phone]

## 2023-01-09 LAB — CREATININE, SERUM
Creatinine, Ser: 1.3 mg/dL — ABNORMAL HIGH (ref 0.76–1.27)
eGFR: 59 mL/min/{1.73_m2} — ABNORMAL LOW (ref >59)

## 2023-01-09 LAB — PSA: Prostate Specific Ag, Serum: 7.4 ng/mL — ABNORMAL HIGH (ref 0.0–4.0)

## 2023-01-29 ENCOUNTER — Ambulatory Visit (HOSPITAL_COMMUNITY)
Admission: RE | Admit: 2023-01-29 | Discharge: 2023-01-29 | Disposition: A | Discharge status: Home / Self Care | Payer: Medicare Other | Source: Ambulatory Visit | Attending: Urology | Admitting: Urology

## 2023-01-29 ENCOUNTER — Encounter (HOSPITAL_COMMUNITY): Payer: Self-pay | Admitting: Radiology

## 2023-01-29 DIAGNOSIS — N201 Calculus of ureter: Secondary | ICD-10-CM | POA: Diagnosis not present

## 2023-01-29 DIAGNOSIS — R3129 Other microscopic hematuria: Secondary | ICD-10-CM | POA: Diagnosis not present

## 2023-01-29 DIAGNOSIS — N281 Cyst of kidney, acquired: Secondary | ICD-10-CM | POA: Diagnosis not present

## 2023-01-29 MED ORDER — IOHEXOL 300 MG/ML  SOLN
125.0000 mL | Freq: Once | INTRAMUSCULAR | Status: AC | PRN
  Administered 2023-01-29: 125 mL via INTRAVENOUS

## 2023-01-30 ENCOUNTER — Encounter: Payer: Self-pay | Admitting: *Deleted

## 2023-01-30 NOTE — Telephone Encounter (Signed)
Letter sent to PCP advising patient not due for repeat TCS until 10/2027

## 2023-02-15 ENCOUNTER — Telehealth: Payer: Self-pay

## 2023-02-15 NOTE — Telephone Encounter (Signed)
Patient is made aware and voiced understanding. 

## 2023-02-15 NOTE — Telephone Encounter (Signed)
-----   Message from Nurse [facility] C sent at 02/14/2023  5:35 PM EDT ----- Please notify patient. ----- Message ----- From: Jerilee Field, MD Sent: 02/12/2023   1:55 PM EDT To: Gustavus Messing, LPN  Let Sammer know his CT didn't show anything worrisome but he is passing two stones on the left side. They are close to or in his bladder. It's important he follows up for cystoscopy so we can check his bladder and look for the stones. Thanks!

## 2023-02-20 DIAGNOSIS — C61 Malignant neoplasm of prostate: Secondary | ICD-10-CM

## 2023-02-20 HISTORY — DX: Malignant neoplasm of prostate: C61

## 2023-02-26 ENCOUNTER — Ambulatory Visit: Payer: Medicare Other | Admitting: Urology

## 2023-02-26 VITALS — BP 111/73 | HR 74

## 2023-02-26 DIAGNOSIS — R3129 Other microscopic hematuria: Secondary | ICD-10-CM | POA: Diagnosis not present

## 2023-02-26 DIAGNOSIS — R972 Elevated prostate specific antigen [PSA]: Secondary | ICD-10-CM

## 2023-02-26 DIAGNOSIS — N201 Calculus of ureter: Secondary | ICD-10-CM

## 2023-02-26 LAB — URINALYSIS, ROUTINE W REFLEX MICROSCOPIC
Bilirubin, UA: NEGATIVE
Glucose, UA: NEGATIVE
Ketones, UA: NEGATIVE
Leukocytes,UA: NEGATIVE
Nitrite, UA: NEGATIVE
Protein,UA: NEGATIVE
Specific Gravity, UA: 1.02 (ref 1.005–1.030)
Urobilinogen, Ur: 2 mg/dL — ABNORMAL HIGH (ref 0.2–1.0)
pH, UA: 6 (ref 5.0–7.5)

## 2023-02-26 LAB — MICROSCOPIC EXAMINATION
Bacteria, UA: NONE SEEN
WBC, UA: NONE SEEN /[HPF] (ref 0–5)

## 2023-02-26 MED ORDER — CIPROFLOXACIN HCL 500 MG PO TABS
500.0000 mg | ORAL_TABLET | Freq: Once | ORAL | Status: AC
  Administered 2023-02-26: 500 mg via ORAL

## 2023-02-26 NOTE — Progress Notes (Unsigned)
   02/26/23  CC: No chief complaint on file.   HPI:  F/u -     1) PSA elevation - his May 2024 PSA was 7.3 and cr 1.38. No prior PSA or prior prostate biopsy. No FH PCa. No OAC.    2) MH - UA today - Aug 2024 - with 3-10 rbc. No gross hematuria. He has a slower stream but not bothered.    Today, seen for the above for three issues - 1) cystoscopy in eval of MH. 2) management of elevated PSA, 3) management of ureteral stones.   He underwent CT scan of the abdomen and pelvis September 2024 which was benign.  His prostate measured about 50 g. No LAD or bone lesions. He had a left 7 mm UVJ stone, then a left 9 mm stone and a ureterocele or possibly the intramural ureter.  There was no hydronephrosis.  His repeat PSA was stable August 2024 at 7.4. PSAD 0.14. UA with 3-10 rbc but no bacteria.    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   Blood pressure 111/73, pulse 74. NED. A&Ox3.   No respiratory distress   Abd soft, NT, ND Normal phallus with bilateral descended testicles  Cystoscopy Procedure Note  Patient identification was confirmed, informed consent was obtained, and patient was prepped using Betadine solution.  Lidocaine jelly was administered per urethral meatus.     Pre-Procedure: - Inspection reveals a normal caliber ureteral meatus.  Procedure: The flexible cystoscope was introduced without difficulty - No urethral strictures/lesions are present. - mild BPH prostate borderline obstruction - normal bladder neck - Bilateral ureteral orifices identified --the left ureteral orifice or intramural tunnel was heaped up consistent with an intraluminal stone. - Bladder mucosa  reveals no ulcers, tumors, or lesions - No bladder stones - No trabeculation  Retroflexion shows normal bladder and bladder   Post-Procedure: - Patient tolerated the procedure well  Assessment/ Plan:  1) Left ureteral stones-discussed with the patient the left ureteral stones and the  nature risk benefits and alternatives to cystoscopy with left retrograde pyelogram, left ureteroscopy laser lithotripsy and stent placement.  He understands he might need a staged procedure.  We also discussed continued surveillance but that may risk stone growth or obstruction.  2) PSA elevation-we discussed the PSA elevation and management with continued surveillance, prostate MRI or biopsy.  Proceed with TRUS biopsy after the ureteroscopy.  Will try to get that set up in [facility] but discussed if there are preauthorization issues we may need to have Dr. Ronne Binning do some of it here in [place].  3) Microscopic hematuria-benign evaluation   No follow-ups on file.  Jerilee Field, MD

## 2023-02-27 ENCOUNTER — Other Ambulatory Visit: Payer: Self-pay | Admitting: Urology

## 2023-02-27 ENCOUNTER — Other Ambulatory Visit (HOSPITAL_COMMUNITY): Payer: Self-pay | Admitting: Urology

## 2023-02-27 DIAGNOSIS — R972 Elevated prostate specific antigen [PSA]: Secondary | ICD-10-CM

## 2023-02-27 DIAGNOSIS — N201 Calculus of ureter: Secondary | ICD-10-CM

## 2023-02-27 MED ORDER — FLEET ENEMA RE ENEM: 1.0000 | ENEMA | Freq: Once | RECTAL | Status: AC

## 2023-03-06 ENCOUNTER — Encounter (HOSPITAL_COMMUNITY)
Admission: RE | Admit: 2023-03-06 | Discharge: 2023-03-06 | Disposition: A | Discharge status: Home / Self Care | Payer: Medicare Other | Source: Ambulatory Visit | Attending: Urology

## 2023-03-06 ENCOUNTER — Encounter (HOSPITAL_COMMUNITY): Payer: Self-pay

## 2023-03-06 ENCOUNTER — Other Ambulatory Visit: Payer: Self-pay

## 2023-03-06 VITALS — BP 127/91 | HR 64 | Temp 98.1°F | Resp 16 | Ht 73.0 in

## 2023-03-06 DIAGNOSIS — Z01818 Encounter for other preprocedural examination: Secondary | ICD-10-CM | POA: Diagnosis not present

## 2023-03-06 HISTORY — DX: Cardiac arrhythmia, unspecified: I49.9

## 2023-03-06 HISTORY — DX: Atherosclerosis of aorta: I70.0

## 2023-03-06 LAB — CBC
HCT: 41.8 % (ref 39.0–52.0)
Hemoglobin: 13.6 g/dL (ref 13.0–17.0)
MCH: 28.1 pg (ref 26.0–34.0)
MCHC: 32.5 g/dL (ref 30.0–36.0)
MCV: 86.4 fL (ref 80.0–100.0)
Platelets: 170 10*3/uL (ref 150–400)
RBC: 4.84 MIL/uL (ref 4.22–5.81)
RDW: 14.1 % (ref 11.5–15.5)
WBC: 5.5 10*3/uL (ref 4.0–10.5)
nRBC: 0 % (ref 0.0–0.2)

## 2023-03-06 LAB — BASIC METABOLIC PANEL
Anion gap: 12 (ref 5–15)
BUN: 29 mg/dL — ABNORMAL HIGH (ref 8–23)
CO2: 19 mmol/L — ABNORMAL LOW (ref 22–32)
Calcium: 9.5 mg/dL (ref 8.9–10.3)
Chloride: 105 mmol/L (ref 98–111)
Creatinine, Ser: 1.5 mg/dL — ABNORMAL HIGH (ref 0.61–1.24)
GFR, Estimated: 49 mL/min — ABNORMAL LOW (ref >60)
Glucose, Bld: 84 mg/dL (ref 70–99)
Potassium: 3.4 mmol/L — ABNORMAL LOW (ref 3.5–5.1)
Sodium: 136 mmol/L (ref 135–145)

## 2023-03-06 NOTE — Progress Notes (Addendum)
Anesthesia Review:  PCP: Elfredia Nevins- LOV 05/19/22  Cardiologist : DR Simona Huh LVO 08/24/21 Chest x-ray : EKG : 03/06/23  Echo : Stress test:08/29/21 Cardiac Cath :  Activity level:  Sleep Study/ CPAP : Fasting Blood Sugar :      / Checks Blood Sugar -- times a day:   Blood Thinner/ Instructions /Last Dose: ASA / Instructions/ Last Dose :    81 mg aspirin    Med hx and preop instructions completed for surgery on 03/13/23.  Significant other with pt at preop appt.

## 2023-03-06 NOTE — Patient Instructions (Addendum)
SURGICAL WAITING ROOM VISITATION  Patients having surgery or a procedure may have no more than 2 support people in the waiting area - these visitors may rotate.    Children under the age of 73 must have an adult with them who is not the patient.  Due to an increase in RSV and influenza rates and associated hospitalizations, children ages 47 and under may not visit patients in [facility] hospitals.  If the patient needs to stay at the hospital during part of their recovery, the visitor guidelines for inpatient rooms apply. Pre-op nurse will coordinate an appropriate time for 1 support person to accompany patient in pre-op.  This support person may not rotate.    Please refer to the [facility] website for the visitor guidelines for Inpatients (after your surgery is over and you are in a regular room).       Your procedure is scheduled on: 06/12/22    Report to [facility] Main Entrance    Report to admitting at  0715 AM   Call this number if you have problems the morning of surgery [phone]   Do not eat food  or drink liquids :After Midnight.               If you have questions, please contact your surgeon's office.       Oral Hygiene is also important to reduce your risk of infection.                                    Remember - BRUSH YOUR TEETH THE MORNING OF SURGERY WITH YOUR REGULAR TOOTHPASTE  DENTURES WILL BE REMOVED PRIOR TO SURGERY PLEASE DO NOT APPLY "Poly grip" OR ADHESIVES!!!   Do NOT smoke after Midnight   Stop all vitamins and herbal supplements 7 days before surgery.   Take these medicines the morning of surgery with A SIP OF WATER:   atenolol, ditropan, protonix   DO NOT TAKE ANY ORAL DIABETIC MEDICATIONS DAY OF YOUR SURGERY  Bring CPAP mask and tubing day of surgery.                              You may not have any metal on your body including hair pins, jewelry, and body piercing             Do not wear make-up, lotions, powders,  perfumes/cologne, or deodorant  Do not wear nail polish including gel and S&S, artificial/acrylic nails, or any other type of covering on natural nails including finger and toenails. If you have artificial nails, gel coating, etc. that needs to be removed by a nail salon please have this removed prior to surgery or surgery may need to be canceled/ delayed if the surgeon/ anesthesia feels like they are unable to be safely monitored.   Do not shave  48 hours prior to surgery.               Men may shave face and neck.   Do not bring valuables to the hospital. [facility] IS NOT             RESPONSIBLE   FOR VALUABLES.   Contacts, glasses, dentures or bridgework may not be worn into surgery.   Bring small overnight bag day of surgery.   DO NOT BRING YOUR HOME MEDICATIONS TO THE HOSPITAL. PHARMACY  WILL DISPENSE MEDICATIONS LISTED ON YOUR MEDICATION LIST TO YOU DURING YOUR ADMISSION IN THE HOSPITAL!    Patients discharged on the day of surgery will not be allowed to drive home.  Someone NEEDS to stay with you for the first 24 hours after anesthesia.   Special Instructions: Bring a copy of your healthcare power of attorney and living will documents the day of surgery if you haven't scanned them before.              Please read over the following fact sheets you were given: IF YOU HAVE QUESTIONS ABOUT YOUR PRE-OP INSTRUCTIONS PLEASE CALL [phone]   If you received a COVID test during your pre-op visit  it is requested that you wear a mask when out in public, stay away from anyone that may not be feeling well and notify your surgeon if you develop symptoms. If you test positive for Covid or have been in contact with anyone that has tested positive in the last 10 days please notify you surgeon.    [place] - Preparing for Surgery Before surgery, you can play an important role.  Because skin is not sterile, your skin needs to be as free of germs as possible.  You can reduce the number of  germs on your skin by washing with CHG (chlorahexidine gluconate) soap before surgery.  CHG is an antiseptic cleaner which kills germs and bonds with the skin to continue killing germs even after washing. Please DO NOT use if you have an allergy to CHG or antibacterial soaps.  If your skin becomes reddened/irritated stop using the CHG and inform your nurse when you arrive at Short Stay. Do not shave (including legs and underarms) for at least 48 hours prior to the first CHG shower.  You may shave your face/neck. Please follow these instructions carefully:  1.  Shower with CHG Soap the night before surgery and the  morning of Surgery.  2.  If you choose to wash your hair, wash your hair first as usual with your  normal  shampoo.  3.  After you shampoo, rinse your hair and body thoroughly to remove the  shampoo.                           4.  Use CHG as you would any other liquid soap.  You can apply chg directly  to the skin and wash                       Gently with a scrungie or clean washcloth.  5.  Apply the CHG Soap to your body ONLY FROM THE NECK DOWN.   Do not use on face/ open                           Wound or open sores. Avoid contact with eyes, ears mouth and genitals (private parts).                       Wash face,  Genitals (private parts) with your normal soap.             6.  Wash thoroughly, paying special attention to the area where your surgery  will be performed.  7.  Thoroughly rinse your body with warm water from the neck down.  8.  DO NOT shower/wash with your normal soap after using  and rinsing off  the CHG Soap.                9.  Pat yourself dry with a clean towel.            10.  Wear clean pajamas.            11.  Place clean sheets on your bed the night of your first shower and do not  sleep with pets. Day of Surgery : Do not apply any lotions/deodorants the morning of surgery.  Please wear clean clothes to the hospital/surgery center.  FAILURE TO FOLLOW THESE  INSTRUCTIONS MAY RESULT IN THE CANCELLATION OF YOUR SURGERY PATIENT SIGNATURE_________________________________  NURSE SIGNATURE__________________________________  ________________________________________________________________________

## 2023-03-07 ENCOUNTER — Encounter (HOSPITAL_COMMUNITY): Payer: Self-pay

## 2023-03-07 NOTE — Anesthesia Preprocedure Evaluation (Addendum)
Anesthesia Evaluation  Patient identified by MRN, date of birth, ID band Patient awake    Reviewed: Allergy & Precautions, NPO status , Patient's Chart, lab work & pertinent test results, reviewed documented beta blocker date and time   History of Anesthesia Complications Negative for: history of anesthetic complications  Airway Mallampati: I  TM Distance: >3 FB Neck ROM: Full    Dental  (+) Dental Advisory Given, Teeth Intact   Pulmonary former smoker   breath sounds clear to auscultation       Cardiovascular hypertension, Pt. on medications and Pt. on home beta blockers (-) angina  Rhythm:Regular Rate:Normal  '23 ECHO: EF 65 to 70%.  1. The LV has normal function, no regional wall motion abnormalities. Left ventricular diastolic parameters were normal.   2. RVF is normal. The right ventricular size is normal.   3. The mitral valve is abnormal. Mild MR. No evidence of mitral stenosis.   4. The aortic valve is tricuspid. AI is not visualized. No aortic stenosis is present.   '23 Stress: Diaphragmatic attenuation. No ischemia/infarct. 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   Endo/Other  negative endocrine ROS    Renal/GU Renal InsufficiencyRenal disease     Musculoskeletal   Abdominal   Peds  Hematology negative hematology ROS (+)   Anesthesia Other Findings   Reproductive/Obstetrics                             Anesthesia Physical Anesthesia Plan  ASA: 3  Anesthesia Plan: General   Post-op Pain Management: Tylenol PO (pre-op)*   Induction: Intravenous  PONV Risk Score and Plan: 2 and Ondansetron and Dexamethasone  Airway Management Planned: LMA  Additional Equipment: None  Intra-op Plan:   Post-operative Plan:   Informed Consent: I have reviewed the patients History  and Physical, chart, labs and discussed the procedure including the risks, benefits and alternatives for the proposed anesthesia with the patient or authorized representative who has indicated his/her understanding and acceptance.     Dental advisory given  Plan Discussed with: CRNA and Surgeon  Anesthesia Plan Comments: (See PAT note from 10/15 by K Gekas PA-C )        Anesthesia Quick Evaluation

## 2023-03-07 NOTE — Progress Notes (Signed)
DISCUSSION: Leonard Stark is a 72 yo male who presents to PAT prior to CYSTOSCOPY LEFT RETROGRADE PYELOGRAM LEFT URETEROSCOPY/HOLMIUM LASER/STENT PLACEMEN and biopsy of prostate on 03/13/23 with Dr. Mena Goes. PMH of former smoking, HTN, HLD, GERD.  Patient was evaluated by Cardiology on 08/24/2021 for abnormal EKG, chest discomfort and orthopnea. ECG showed sinus rhythm with poor R wave progression rule out old anterior infarct pattern. He had a cath in 2011 which showed mild coronary atherosclerosis, nonobstructive and with normal LVEF.  A stress test and echo were recommended for further evaluation. These studies came back normal. Advised to f/u with PCP if there were ongoing symptoms.  VS: BP (!) 127/91   Pulse 64   Temp 36.7 C (Oral)   Resp 16   Ht 6\' 1"  (1.854 m)   SpO2 96%   BMI 25.23 kg/m   PROVIDERS: Assunta Found, MD   LABS: Labs reviewed: Acceptable for surgery. (all labs ordered are listed, but only abnormal results are displayed)  Labs Reviewed  BASIC METABOLIC PANEL - Abnormal; Notable for the following components:      Result Value   Potassium 3.4 (*)    CO2 19 (*)    BUN 29 (*)    Creatinine, Ser 1.50 (*)    GFR, Estimated 49 (*)    All other components within normal limits  CBC     IMAGES:  CT abdomen/pelvis 01/29/2023:  IMPRESSION: 1. Left ureterovesical junction 7 mm stone. Normal caliber ureters. No hydronephrosis. 2. Layering 10 x 7 mm bladder stone. 3. Bilateral diffuse punctate caliceal calcifications, chronic and not appreciably changed from 12/30/2012 CT, most compatible with medullary nephrocalcinosis. 4. No suspicious renal cortical masses. No evidence of urothelial lesions. 5. Mildly enlarged prostate. 6. Coronary atherosclerosis. 7.  Aortic Atherosclerosis (ICD10-I70.0).   EKG 03/06/23:  Sinus bradycardia with sinus arrhythmia, rate 53   CV:  Echo 09/27/2021:  IMPRESSIONS     1. Left ventricular ejection fraction, by estimation, is  65 to 70%. The  left ventricle has normal function. The left ventricle has no regional  wall motion abnormalities. Left ventricular diastolic parameters were  normal.   2. Right ventricular systolic function is normal. The right ventricular  size is normal.   3. The mitral valve is abnormal. Mild mitral valve regurgitation. No  evidence of mitral stenosis.   4. The aortic valve is tricuspid. Aortic valve regurgitation is not  visualized. No aortic stenosis is present.   5. The inferior vena cava is normal in size with greater than 50%  respiratory variability, suggesting right atrial pressure of 3 mmHg.   Stress test 08/29/2021:    The study is normal. The study is low risk.   No ST deviation was noted.   LV perfusion is normal. There is no evidence of ischemia. There is no evidence of infarction.   Left ventricular function is normal. End diastolic cavity size is normal. End systolic cavity size is normal.   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  Acute appendicitis with perforation on path as well.   CARDIAC CATHETERIZATION  2011   COLONOSCOPY  10/13/2011   benign polyps, poor prep compromised exam, next TCS 7 years.    COLONOSCOPY N/A 11/09/2017   Dr. Jena Gauss: Tortuous colon, melanosis coli.  Repeat colonoscopy in 10 years.   ESOPHAGOGASTRODUODENOSCOPY N/A 11/09/2017   Dr. Jena Gauss: Small hiatal hernia, normal esophagus status post dilation for dysphagia.   MALONEY DILATION N/A 11/09/2017   Procedure: Elease Hashimoto  DILATION;  Surgeon: Corbin Ade, MD;  Location: [facility] SU[facility];  Service: Endoscopy;  Laterality: N/A;    MEDICATIONS:  acetaminophen (TYLENOL) 500 MG tablet   aspirin EC 81 MG tablet   atenolol (TENORMIN) 50 MG tablet   clonazePAM (KLONOPIN) 0.5 MG tablet   Coenzyme Q10 (COQ10) 100 MG CAPS   linaclotide (LINZESS) 290 MCG CAPS capsule   Omega-3 Fatty Acids (FISH OIL) 1000 MG CAPS   oxybutynin (DITROPAN XL) 15 MG 24 hr tablet   pantoprazole (PROTONIX) 40 MG tablet   pravastatin (PRAVACHOL) 40 MG tablet   valsartan-hydrochlorothiazide (DIOVAN-HCT) 320-25 MG tablet   No current facility-administered medications for this encounter.    sodium phosphate (FLEET) enema 1 enema   Ubaldo Glassing, PA-C [facility] Surgical Short Stay/Anesthesiology [facility] Phone [phone] 03/07/2023 11:19 AM

## 2023-03-13 ENCOUNTER — Other Ambulatory Visit: Payer: Self-pay

## 2023-03-13 ENCOUNTER — Encounter (HOSPITAL_COMMUNITY): Payer: Self-pay | Admitting: Urology

## 2023-03-13 ENCOUNTER — Ambulatory Visit (HOSPITAL_COMMUNITY): Payer: Medicare Other | Admitting: Medical

## 2023-03-13 ENCOUNTER — Encounter (HOSPITAL_COMMUNITY)
Admission: RE | Disposition: A | Discharge status: Home / Self Care | Payer: Self-pay | Source: Ambulatory Visit | Attending: Urology

## 2023-03-13 ENCOUNTER — Ambulatory Visit (HOSPITAL_BASED_OUTPATIENT_CLINIC_OR_DEPARTMENT_OTHER): Payer: Medicare Other | Admitting: Anesthesiology

## 2023-03-13 ENCOUNTER — Ambulatory Visit (HOSPITAL_COMMUNITY)
Admission: RE | Admit: 2023-03-13 | Discharge: 2023-03-13 | Disposition: A | Discharge status: Home / Self Care | Payer: Medicare Other | Source: Ambulatory Visit | Attending: Urology | Admitting: Urology

## 2023-03-13 ENCOUNTER — Ambulatory Visit (HOSPITAL_COMMUNITY): Payer: Medicare Other

## 2023-03-13 DIAGNOSIS — I10 Essential (primary) hypertension: Secondary | ICD-10-CM | POA: Diagnosis not present

## 2023-03-13 DIAGNOSIS — Z01818 Encounter for other preprocedural examination: Secondary | ICD-10-CM

## 2023-03-13 DIAGNOSIS — C61 Malignant neoplasm of prostate: Secondary | ICD-10-CM | POA: Insufficient documentation

## 2023-03-13 DIAGNOSIS — Z87891 Personal history of nicotine dependence: Secondary | ICD-10-CM | POA: Diagnosis not present

## 2023-03-13 DIAGNOSIS — N201 Calculus of ureter: Secondary | ICD-10-CM

## 2023-03-13 DIAGNOSIS — R972 Elevated prostate specific antigen [PSA]: Secondary | ICD-10-CM | POA: Diagnosis not present

## 2023-03-13 DIAGNOSIS — K219 Gastro-esophageal reflux disease without esophagitis: Secondary | ICD-10-CM | POA: Insufficient documentation

## 2023-03-13 HISTORY — PX: PROSTATE BIOPSY: SHX241

## 2023-03-13 HISTORY — PX: CYSTOSCOPY/URETEROSCOPY/HOLMIUM LASER/STENT PLACEMENT: SHX6546

## 2023-03-13 SURGERY — CYSTOSCOPY/URETEROSCOPY/HOLMIUM LASER/STENT PLACEMENT
Anesthesia: General

## 2023-03-13 MED ORDER — ORAL CARE MOUTH RINSE: 15.0000 mL | Freq: Once | OROMUCOSAL | Status: AC

## 2023-03-13 MED ORDER — SODIUM CHLORIDE 0.9 % IV SOLN
2.0000 g | INTRAVENOUS | Status: AC
  Administered 2023-03-13: 2 g via INTRAVENOUS
  Filled 2023-03-13: qty 20

## 2023-03-13 MED ORDER — MIDAZOLAM HCL 2 MG/2ML IJ SOLN
INTRAMUSCULAR | Status: AC
  Filled 2023-03-13: qty 2

## 2023-03-13 MED ORDER — LIDOCAINE HCL (PF) 2 % IJ SOLN
INTRAMUSCULAR | Status: AC
  Filled 2023-03-13: qty 5

## 2023-03-13 MED ORDER — FENTANYL CITRATE (PF) 100 MCG/2ML IJ SOLN
INTRAMUSCULAR | Status: AC
  Filled 2023-03-13: qty 2

## 2023-03-13 MED ORDER — AMISULPRIDE (ANTIEMETIC) 5 MG/2ML IV SOLN
INTRAVENOUS | Status: AC
  Filled 2023-03-13: qty 4

## 2023-03-13 MED ORDER — OXYCODONE HCL 5 MG/5ML PO SOLN: 5.0000 mg | Freq: Once | ORAL | Status: DC | PRN

## 2023-03-13 MED ORDER — EPHEDRINE SULFATE (PRESSORS) 50 MG/ML IJ SOLN
INTRAMUSCULAR | Status: DC | PRN
  Administered 2023-03-13: 10 mg via INTRAVENOUS
  Administered 2023-03-13: 5 mg via INTRAVENOUS
  Administered 2023-03-13: 10 mg via INTRAVENOUS

## 2023-03-13 MED ORDER — MIDAZOLAM HCL 5 MG/5ML IJ SOLN
INTRAMUSCULAR | Status: DC | PRN
  Administered 2023-03-13: 2 mg via INTRAVENOUS

## 2023-03-13 MED ORDER — MIDAZOLAM HCL 2 MG/2ML IJ SOLN: 0.5000 mg | Freq: Once | INTRAMUSCULAR | Status: DC | PRN

## 2023-03-13 MED ORDER — IOHEXOL 300 MG/ML  SOLN
INTRAMUSCULAR | Status: DC | PRN
  Administered 2023-03-13: 10 mL

## 2023-03-13 MED ORDER — HYDROMORPHONE HCL 1 MG/ML IJ SOLN: 0.2500 mg | INTRAMUSCULAR | Status: DC | PRN

## 2023-03-13 MED ORDER — ACETAMINOPHEN 500 MG PO TABS
1000.0000 mg | ORAL_TABLET | Freq: Once | ORAL | Status: AC
  Administered 2023-03-13: 1000 mg via ORAL
  Filled 2023-03-13: qty 2

## 2023-03-13 MED ORDER — DEXAMETHASONE SODIUM PHOSPHATE 10 MG/ML IJ SOLN
INTRAMUSCULAR | Status: DC | PRN
Start: 2023-03-13 — End: 2023-03-13
  Administered 2023-03-13: 10 mg via INTRAVENOUS

## 2023-03-13 MED ORDER — PHENYLEPHRINE 80 MCG/ML (10ML) SYRINGE FOR IV PUSH (FOR BLOOD PRESSURE SUPPORT)
PREFILLED_SYRINGE | INTRAVENOUS | Status: AC
  Filled 2023-03-13: qty 10

## 2023-03-13 MED ORDER — DEXAMETHASONE SODIUM PHOSPHATE 10 MG/ML IJ SOLN
INTRAMUSCULAR | Status: AC
  Filled 2023-03-13: qty 1

## 2023-03-13 MED ORDER — OXYCODONE HCL 5 MG PO TABS: 5.0000 mg | ORAL_TABLET | Freq: Once | ORAL | Status: DC | PRN

## 2023-03-13 MED ORDER — ONDANSETRON HCL 4 MG/2ML IJ SOLN
INTRAMUSCULAR | Status: AC
  Filled 2023-03-13: qty 2

## 2023-03-13 MED ORDER — LIDOCAINE HCL (CARDIAC) PF 100 MG/5ML IV SOSY
PREFILLED_SYRINGE | INTRAVENOUS | Status: DC | PRN
  Administered 2023-03-13: 40 mg via INTRATRACHEAL

## 2023-03-13 MED ORDER — LACTATED RINGERS IV SOLN: INTRAVENOUS | Status: DC

## 2023-03-13 MED ORDER — ONDANSETRON HCL 4 MG/2ML IJ SOLN
INTRAMUSCULAR | Status: DC | PRN
  Administered 2023-03-13: 4 mg via INTRAVENOUS

## 2023-03-13 MED ORDER — FENTANYL CITRATE (PF) 100 MCG/2ML IJ SOLN
INTRAMUSCULAR | Status: DC | PRN
  Administered 2023-03-13: 50 ug via INTRAVENOUS
  Administered 2023-03-13 (×2): 25 ug via INTRAVENOUS

## 2023-03-13 MED ORDER — AMISULPRIDE (ANTIEMETIC) 5 MG/2ML IV SOLN
10.0000 mg | Freq: Once | INTRAVENOUS | Status: AC
  Administered 2023-03-13: 10 mg via INTRAVENOUS

## 2023-03-13 MED ORDER — PROPOFOL 10 MG/ML IV BOLUS
INTRAVENOUS | Status: DC | PRN
  Administered 2023-03-13: 120 mg via INTRAVENOUS

## 2023-03-13 MED ORDER — CHLORHEXIDINE GLUCONATE 0.12 % MT SOLN
15.0000 mL | Freq: Once | OROMUCOSAL | Status: AC
  Administered 2023-03-13: 15 mL via OROMUCOSAL

## 2023-03-13 MED ORDER — SODIUM CHLORIDE 0.9 % IR SOLN
Status: DC | PRN
  Administered 2023-03-13: 3000 mL via INTRAVESICAL

## 2023-03-13 MED ORDER — PHENYLEPHRINE 80 MCG/ML (10ML) SYRINGE FOR IV PUSH (FOR BLOOD PRESSURE SUPPORT)
PREFILLED_SYRINGE | INTRAVENOUS | Status: DC | PRN
  Administered 2023-03-13: 80 ug via INTRAVENOUS
  Administered 2023-03-13 (×3): 160 ug via INTRAVENOUS

## 2023-03-13 SURGICAL SUPPLY — 23 items
BAG URO CATCHER STRL LF (MISCELLANEOUS) ×2 IMPLANT
BASKET ZERO TIP NITINOL 2.4FR (BASKET) IMPLANT
BSKT STON RTRVL ZERO TP 2.4FR (BASKET) ×2
CATH URETL OPEN END 6FR 70 (CATHETERS) ×2 IMPLANT
CLOTH BEACON ORANGE TIMEOUT ST (SAFETY) ×2 IMPLANT
FIBER LASER MOSES 365 DFL (Laser) IMPLANT
GLOVE BIO SURGEON STRL SZ7.5 (GLOVE) ×2 IMPLANT
GOWN STRL REUS W/ TWL XL LVL3 (GOWN DISPOSABLE) ×2 IMPLANT
GOWN STRL REUS W/TWL XL LVL3 (GOWN DISPOSABLE) ×2
GUIDEWIRE STR DUAL SENSOR (WIRE) ×2 IMPLANT
GUIDEWIRE ZIPWRE .038 STRAIGHT (WIRE) IMPLANT
INSTR BIOPSY MAXCORE 18GX20 (NEEDLE) IMPLANT
KIT TURNOVER KIT A (KITS) IMPLANT
MANIFOLD NEPTUNE II (INSTRUMENTS) ×2 IMPLANT
PACK CYSTO (CUSTOM PROCEDURE TRAY) ×2 IMPLANT
PAD PREP 24X48 CUFFED NSTRL (MISCELLANEOUS) ×2 IMPLANT
SHEATH NAVIGATOR HD 11/13X28 (SHEATH) IMPLANT
SHEATH NAVIGATOR HD 11/13X36 (SHEATH) IMPLANT
STENT URET 6FRX26 CONTOUR (STENTS) IMPLANT
SYR CONTROL 10ML LL (SYRINGE) IMPLANT
TUBING CONNECTING 10 (TUBING) ×2 IMPLANT
TUBING UROLOGY SET (TUBING) ×2 IMPLANT
UNDERPAD 30X36 HEAVY ABSORB (UNDERPADS AND DIAPERS) ×2 IMPLANT

## 2023-03-13 NOTE — H&P (Signed)
H&P  Chief Complaint: ureteral stones, elevated PSA   History of Present Illness:   1) PSA elevation - his May 2024 PSA was 7.3 and cr 1.38. No prior PSA or prior prostate biopsy. No FH PCa. No OAC. His repeat PSA was stable August 2024 at 7.4. PSAD 0.14. Prostate about 50 g on CT.    2) MH - Aug 2024 and Oct 2024 UA with 3-10 rbc. No gross hematuria. He has a slower stream but not bothered. Sep 2024 CT benign, Oct 2024 cysto benign   3) left ureteral stones - CT scan of the abdomen and pelvis September 2024 which was benign.  His prostate measured about 50 g. No LAD or bone lesions. He had a left 7 mm UVJ stone, then a left 9 mm stone and a ureterocele or possibly the intramural ureter.  There was no hydronephrosis. Oct 2024 office cysto found no bladder stone.    He presents today for cystoscopy, left retrograde pyelogram, left ureteroscopy laser lithotripsy, left ureteral stent placement and transrectal ultrasound of the prostate with prostate biopsy.  He has been well.  No dysuria or gross hematuria.  No fever.  No cough cold or congestion.   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  Acute appendicitis with perforation on path as well.   CARDIAC CATHETERIZATION  2011   COLONOSCOPY  10/13/2011   benign polyps, poor prep compromised exam, next TCS 7 years.    COLONOSCOPY N/A 11/09/2017   Dr. Jena Gauss: Tortuous colon, melanosis coli.  Repeat colonoscopy in 10 years.   ESOPHAGOGASTRODUODENOSCOPY N/A  11/09/2017   Dr. Jena Gauss: Small hiatal hernia, normal esophagus status post dilation for dysphagia.   MALONEY DILATION N/A 11/09/2017   Procedure: Elease Hashimoto DILATION;  Surgeon: Corbin Ade, MD;  Location: [facility];  Service: Endoscopy;  Laterality: N/A;    Home Medications:  Medications Prior to Admission  Medication Sig Dispense Refill Last Dose   aspirin EC 81 MG tablet Take 81 mg by mouth daily. Swallow whole.   Past Week   atenolol (TENORMIN) 50 MG tablet Take 50 mg by mouth every morning.   03/13/2023 at 0500   clonazePAM (KLONOPIN) 0.5 MG tablet Take 0.5 mg by mouth 2 (two) times daily as needed for anxiety.   Past Week   Coenzyme Q10 (COQ10) 100 MG CAPS Take 100 mg by mouth daily.   Past Week   linaclotide (LINZESS) 290 MCG CAPS capsule Take 290 mcg by mouth daily.   Past Week   Omega-3 Fatty Acids (FISH OIL) 1000 MG CAPS Take 1,000 mg by mouth daily.   Past Week   oxybutynin (DITROPAN XL) 15 MG 24 hr tablet Take 15 mg by mouth daily.   Past Week   pantoprazole (PROTONIX) 40 MG tablet TAKE 1 TABLET TWICE A DAY BEFORE MEALS 180 tablet 3 Past Week   pravastatin (PRAVACHOL) 40 MG tablet Take 40 mg by mouth every morning.   Past  Week   valsartan-hydrochlorothiazide (DIOVAN-HCT) 320-25 MG tablet Take 1 tablet by mouth daily.   Past Week   acetaminophen (TYLENOL) 500 MG tablet Take 1,000 mg by mouth every 6 (six) hours as needed for moderate pain.   More than a month   Allergies: No Known Allergies  Family History  Problem Relation Age of Onset   Stroke Mother    Hypertension Sister    Diabetes Sister    Colon cancer Neg Hx    Social History:  reports that he has quit smoking. He has never used smokeless tobacco. He reports that he does not drink alcohol and does not use drugs.  ROS: A complete review of systems was performed.  All systems are negative except for pertinent findings as noted. Review of Systems  All other systems reviewed and are negative.    Physical Exam:   Vital signs in last 24 hours: Temp:  [97.8 F (36.6 C)] 97.8 F (36.6 C) (10/22 0656) Pulse Rate:  [50] 50 (10/22 0656) Resp:  [16] 16 (10/22 0656) BP: (131)/(88) 131/88 (10/22 0656) SpO2:  [96 %] 96 % (10/22 0656) Weight:  [86.3 kg] 86.3 kg (10/22 0656) General:  Alert and oriented, No acute distress HEENT: Normocephalic, atraumatic Cardiovascular: Regular rate and rhythm Lungs: Regular rate and effort Abdomen: Soft, nontender, nondistended, no abdominal masses Back: No CVA tenderness Extremities: No edema Neurologic: Grossly intact  Laboratory Data:  No results found for this or any previous visit (from the past 24 hour(s)). No results found for this or any previous visit (from the past 240 hour(s)). Creatinine: Recent Labs    03/06/23 1427  CREATININE 1.50*    Impression/Assessment:  Microscopic hematuria, left ureteral stones, elevated PSA-  Plan:  I discussed again with the patient and with [name] the nature, potential benefits, risks and alternatives to cystoscopy, left retrograde pyelogram, left ureteroscopy laser lithotripsy, left ureteral stent placement and transrectal ultrasound of the prostate with prostate biopsy, including side effects of the proposed treatment, the likelihood of the patient achieving the goals of the procedure, and any potential problems that might occur during the procedure or recuperation.  Discussed he may need a prestent and staged procedure.  All questions answered. Patient elects to proceed.   Jerilee Field 03/13/2023, 9:07 AM

## 2023-03-13 NOTE — Anesthesia Postprocedure Evaluation (Signed)
Anesthesia Post Note  Patient: TANE CHOY  Procedure(s) Performed: CYSTOSCOPY LEFT RETROGRADE PYELOGRAM LEFT URETEROSCOPY/HOLMIUM LASER/STENT PLACEMENT (Left) BIOPSY TRANSRECTAL ULTRASONIC PROSTATE (TUBP)     Patient location during evaluation: PACU Anesthesia Type: General Level of consciousness: awake and alert, patient cooperative and oriented Pain management: pain level controlled Vital Signs Assessment: post-procedure vital signs reviewed and stable Respiratory status: spontaneous breathing, nonlabored ventilation and respiratory function stable Cardiovascular status: blood pressure returned to baseline and stable Postop Assessment: no apparent nausea or vomiting Anesthetic complications: no   No notable events documented.  Last Vitals:  Vitals:   03/13/23 1115 03/13/23 1200  BP: 109/78 116/76  Pulse: (!) 58 (!) 55  Resp: 16 14  Temp:    SpO2: 98% 96%    Last Pain:  Vitals:   03/13/23 1200  PainSc: 0-No pain                 Wynnie Pacetti,E. Ebbie Cherry

## 2023-03-13 NOTE — Op Note (Signed)
Preoperative diagnosis: Microscopic hematuria, left ureteral stones, PSA elevation Postoperative diagnosis: Same  Procedure:  1.Cystoscopy left retrograde pyelogram, left ureteroscopy, laser lithotripsy, left ureteral stent placement 2. transrectal ultrasound of the prostate 3. Prostate biopsy  Surgeon: Mena Goes  Anesthesia: General  Indication for procedure: Alvoid is a 72 year old male with 2 distal stones noted on CT scan as well as PSA elevation.  Findings: On cystoscopy the urethra, prostate and bladder were unremarkable.  Prostate was mildly enlarged with borderline bladder outlet obstruction.  No median lobe.  Mild bladder trabeculation.  No stone or foreign body in the bladder.  The ureteral orifice ease were in their normal orthotopic position with clear E flux.  The left intramural tunnel was dilated consistent with a ureterocele or possible dilation from the stones.  Left retrograde pyelogram-this outlined a single ureter single collecting system unit with 2 filling defects and a dilated left distal ureter consistent with the stones and a small ureterocele.  On left ureteroscopy the ureter was easily entered and seem to be widely patent and draining therefore no formal ureterocele incision was made.  The stones were dusted and fragmented-some of the larger fragments were collected for analysis.  Exam under anesthesia-penis was uncircumcised.  The foreskin and the glans appeared normal.  The meatus appeared normal.  Scrotum was normal.  On DRE the prostate was about 40 g and smooth but slightly indurated may be right more than left.  Transrectal ultrasound of the prostate-the seminal vesicle and prostate appeared normal.  No significant hypoechoic areas or mass.  Capsule appeared normal.  Length 4.76 cm, width 5.63 cm, height 3.4 cm, volume 46.87 mL  Transrectal ultrasound guidance was used for standard 12 core biopsy.  Description of procedure: After consent was obtained patient  was brought to the operating room.  He is placed in lithotomy position and prepped and draped in the usual sterile fashion.  Timeout was performed to confirm the patient and procedure.  Cystoscope was passed per urethra and the bladder inspected.  Left ureteral orifice was cannulated with a 5 [place] open-ended catheter and left retrograde injection of contrast was performed.  Sensor wire was then advanced and coiled in the upper calyx.  A dual channel digital ureteroscope was passed per urethra and then through the left ureteral orifice without difficulty where the stones were noted.  The larger stone was dusted and fragmented and then the second stone was dusted and fragmented.  After a couple of minutes of fragmentation it was apparent these were large stones.  I backed the scope out and placed the cystoscope to drain the bladder and then passed the 5 [place] open-ended catheter through the cystoscope and backed out the scope leaving the 5 [place] open-ended catheter in the bladder to drain the bladder.  This semirigid scope was readvanced and the stones were completed.  I then took a 0 tip basket and cleared any fragments into the bladder.  I then perform ureteroscopy well up into the mid ureter without any other stones noted.  No injury was noted.  The cystoscope was then passed back per urethra into the bladder all the stones were drained and collected.  The wire was then backloaded on the cystoscope and a 626 cm stent was advanced.  The wire was removed with a good coil seen in the kidney and a good coil in the bladder.  The scope was backed out and the string was taped to the penis.  Exam under anesthesia was performed.  Foreskin  was pulled back over the glans.  Transrectal ultrasound was passed per urethra and transrectal ultrasound of the prostate was performed.  Standard 12 core prostate biopsy was performed going from the right and then the left, base, mid to apex, lateral to medial.  Ultrasound was  removed and no bleeding per rectum was noted.  He was then awakened taken the cover room in stable condition.  Complications: None  Blood loss: Minimal  Specimens to lab: Stone fragments  Specimens to pathology: 12 core prostate biopsy- #1 right base lateral #2 right base medial #3 right mid lateral #4 right mid medial #5 right apex lateral #6 right apex medial #7 left base lateral #8 left base medial #9 left mid lateral #10 left mid medial #11 left apex lateral #12 of left apex medial  Drains: 6 x 26 cm left ureteral stent with tether  Disposition: Patient stable to PACU.

## 2023-03-13 NOTE — Anesthesia Procedure Notes (Signed)
Procedure Name: MAC Date/Time: 03/13/2023 9:25 AM  Performed by: Donna Bernard, CRNAPre-anesthesia Checklist: Patient identified, Emergency Drugs available, Suction available, Patient being monitored and Timeout performed Patient Re-evaluated:Patient Re-evaluated prior to induction Oxygen Delivery Method: Circle system utilized Preoxygenation: Pre-oxygenation with 100% oxygen Induction Type: IV induction Ventilation: Mask ventilation without difficulty LMA: LMA inserted LMA Size: 5.0 Number of attempts: 1 Tube secured with: Tape Dental Injury: Teeth and Oropharynx as per pre-operative assessment

## 2023-03-13 NOTE — Transfer of Care (Signed)
Immediate Anesthesia Transfer of Care Note  Patient: Leonard Stark  Procedure(s) Performed: CYSTOSCOPY LEFT RETROGRADE PYELOGRAM LEFT URETEROSCOPY/HOLMIUM LASER/STENT PLACEMENT (Left) BIOPSY TRANSRECTAL ULTRASONIC PROSTATE (TUBP)  Patient Location: PACU  Anesthesia Type:General  Level of Consciousness: awake and patient cooperative  Airway & Oxygen Therapy: Patient Spontanous Breathing and Patient connected to face mask  Post-op Assessment: Report given to RN and Post -op Vital signs reviewed and stable  Post vital signs: Reviewed and stable  Last Vitals:  Vitals Value Taken Time  BP 126/79 03/13/23 1045  Temp    Pulse 72 03/13/23 1046  Resp 20 03/13/23 1046  SpO2 100 % 03/13/23 1046  Vitals shown include unfiled device data.  Last Pain:  Vitals:   03/13/23 0652  PainSc: 0-No pain         Complications: No notable events documented.

## 2023-03-13 NOTE — Discharge Instructions (Signed)
Removal of the stent: Remove the stent on Friday morning, March 16, 2023 by pulling the string as instructed

## 2023-03-14 ENCOUNTER — Encounter (HOSPITAL_COMMUNITY): Payer: Self-pay | Admitting: Urology

## 2023-03-14 DIAGNOSIS — I10 Essential (primary) hypertension: Secondary | ICD-10-CM | POA: Diagnosis not present

## 2023-03-14 DIAGNOSIS — N2889 Other specified disorders of kidney and ureter: Secondary | ICD-10-CM | POA: Diagnosis not present

## 2023-03-14 DIAGNOSIS — N200 Calculus of kidney: Secondary | ICD-10-CM | POA: Diagnosis not present

## 2023-03-14 DIAGNOSIS — Z960 Presence of urogenital implants: Secondary | ICD-10-CM | POA: Diagnosis not present

## 2023-03-14 DIAGNOSIS — N23 Unspecified renal colic: Secondary | ICD-10-CM | POA: Diagnosis not present

## 2023-03-15 LAB — SURGICAL PATHOLOGY

## 2023-03-20 LAB — CALCULI, WITH PHOTOGRAPH (CLINICAL LAB)
Calcium Oxalate Dihydrate: 10 %
Calcium Oxalate Monohydrate: 90 %
Weight Calculi: 98 mg

## 2023-03-23 ENCOUNTER — Telehealth: Payer: Self-pay | Admitting: Urology

## 2023-03-23 NOTE — Telephone Encounter (Signed)
I called and spoke to [place].  He is doing well.  He removed the stent without difficulty.  I discussed with him the prostate cancer diagnosis.  We went over his stage grade and prognosis.  We discussed the nature risks and benefits of active surveillance versus treatment.  We discussed surgery versus radiation.  He will consider.  October 2024- PSA 7.4 T1c (although I did note a difference between the right and the left side of his prostate although I thought the right side felt more firm. 5/6 biopsies were positive on the left). Prostate 47 g (PSA density 0.16) GG 2 in 4 cores, left 10 to 40% (pattern 4 in 5-20%) GG 1 in  1 core on the left 10% 5/12

## 2023-03-26 ENCOUNTER — Ambulatory Visit: Payer: Medicare Other | Admitting: Urology

## 2023-03-26 VITALS — BP 104/66 | HR 61

## 2023-03-26 DIAGNOSIS — N138 Other obstructive and reflux uropathy: Secondary | ICD-10-CM

## 2023-03-26 DIAGNOSIS — N201 Calculus of ureter: Secondary | ICD-10-CM

## 2023-03-26 DIAGNOSIS — C61 Malignant neoplasm of prostate: Secondary | ICD-10-CM | POA: Diagnosis not present

## 2023-03-26 DIAGNOSIS — R350 Frequency of micturition: Secondary | ICD-10-CM

## 2023-03-26 DIAGNOSIS — N401 Enlarged prostate with lower urinary tract symptoms: Secondary | ICD-10-CM

## 2023-03-26 LAB — URINALYSIS, ROUTINE W REFLEX MICROSCOPIC
Bilirubin, UA: NEGATIVE
Glucose, UA: NEGATIVE
Ketones, UA: NEGATIVE
Leukocytes,UA: NEGATIVE
Nitrite, UA: NEGATIVE
Protein,UA: NEGATIVE
Specific Gravity, UA: 1.02 (ref 1.005–1.030)
Urobilinogen, Ur: 0.2 mg/dL (ref 0.2–1.0)
pH, UA: 6 (ref 5.0–7.5)

## 2023-03-26 LAB — MICROSCOPIC EXAMINATION: Bacteria, UA: NONE SEEN

## 2023-03-26 MED ORDER — TAMSULOSIN HCL 0.4 MG PO CAPS: 0.4000 mg | ORAL_CAPSULE | Freq: Every day | ORAL | 3 refills | Status: DC

## 2023-03-26 NOTE — Patient Instructions (Signed)
      To Whom it May Concern;   Please excuse Leonard Stark away from work on 03/13/2023 to 03/26/2023.  He may return to work on 03/27/2023 This patient has been under my care. If any questions, please call our office at [phone].   Thank you, Dr. Jerilee Field

## 2023-03-26 NOTE — Progress Notes (Unsigned)
03/26/2023 1:53 PM   Leonard Stark [birth year] [id number]  Referring provider: Assunta Found, MD [address] [place],  [place] [address]  No chief complaint on file.   HPI:  F/u -   1) PCa -he was diagnosed with a favorable intermediate risk prostate cancer on biopsy October 2024 (TRUS bx at time of left URS, laser, stent).  Presented with May 2024 PSA was 7.3 and cr 1.38. No prior PSA or prior prostate biopsy. No FH PCa. No OAC. His repeat PSA was stable August 2024 at 7.4.  He is on oxybutynin from his GI doc.   Biopsy:  October 2024- PSA 7.4 T1c (although I did note a difference between the right and the left side of his prostate although I thought the right side felt more firm. 5/6 biopsies were positive on the left). Prostate 47 g (PSA density 0.16) GG 2 in 4 cores, left 10 to 40% (pattern 4 in 5-20%) GG 1 in  1 core on the left 10% 5/12 Staging: Sep 2024 CT - no LAD or bone lesions   2) left distal stones -status post left ureteroscopy, laser lithotripsy and stent for 2 left distal stones and a left ureterocele October 2024.  He remove the stent with the string.  Today, seen for the above. He's had some frequency and weak stream since removing the stent. He also had pain p the surgery and went to [facility]. KUB with stent in good position. He started tamsulosin but only for 10 days. It helped.   UA today looks good. No bacteria.   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  Acute appendicitis with  perforation on path as well.   CARDIAC CATHETERIZATION  2011   COLONOSCOPY  10/13/2011   benign polyps, poor prep compromised exam, next TCS 7 years.    COLONOSCOPY N/A 11/09/2017   Dr. Jena Gauss: Tortuous colon, melanosis coli.  Repeat colonoscopy in 10 years.   CYSTOSCOPY/URETEROSCOPY/HOLMIUM LASER/STENT PLACEMENT Left 03/13/2023   Procedure: CYSTOSCOPY LEFT RETROGRADE PYELOGRAM LEFT URETEROSCOPY/HOLMIUM LASER/STENT PLACEMENT;  Surgeon: Jerilee Field, MD;  Location: [facility];  Service: Urology;  Laterality: Left;   ESOPHAGOGASTRODUODENOSCOPY N/A 11/09/2017   Dr. Jena Gauss: Small hiatal hernia, normal esophagus status post dilation for dysphagia.   MALONEY DILATION N/A 11/09/2017   Procedure: Elease Hashimoto DILATION;  Surgeon: Corbin Ade, MD;  Location: [facility];  Service: Endoscopy;  Laterality: N/A;   PROSTATE BIOPSY N/A 03/13/2023   Procedure: BIOPSY TRANSRECTAL ULTRASONIC PROSTATE (TUBP);  Surgeon: Jerilee Field, MD;  Location: [facility];  Service: Urology;  Laterality: N/A;  90 MINS FOR CASE    Home Medications:  Allergies as of 03/26/2023   No Known Allergies      Medication List        Accurate as of March 26, 2023  1:53 PM. If you have any questions, ask your nurse or doctor.          acetaminophen 500 MG tablet Commonly known as: TYLENOL Take 1,000 mg  by mouth every 6 (six) hours as needed for moderate pain.   aspirin EC 81 MG tablet Take 81 mg by mouth daily. Swallow whole.   atenolol 50 MG tablet Commonly known as: TENORMIN Take 50 mg by mouth every morning.   clonazePAM 0.5 MG tablet Commonly known as: KLONOPIN Take 0.5 mg by mouth 2 (two) times daily as needed for anxiety.   CoQ10 100 MG Caps Take 100 mg by mouth daily.   Fish Oil 1000 MG Caps Take 1,000 mg by mouth daily.   Linzess 290 MCG Caps capsule Generic drug: linaclotide Take 290 mcg by mouth daily.   oxybutynin 15 MG 24 hr tablet Commonly known as: DITROPAN XL Take 15 mg by mouth daily.    pantoprazole 40 MG tablet Commonly known as: PROTONIX TAKE 1 TABLET TWICE A DAY BEFORE MEALS   pravastatin 40 MG tablet Commonly known as: PRAVACHOL Take 40 mg by mouth every morning.   valsartan-hydrochlorothiazide 320-25 MG tablet Commonly known as: DIOVAN-HCT Take 1 tablet by mouth daily.        Allergies: No Known Allergies  Family History: Family History  Problem Relation Age of Onset   Stroke Mother    Hypertension Sister    Diabetes Sister    Colon cancer Neg Hx     Social History:  reports that he has quit smoking. He has never used smokeless tobacco. He reports that he does not drink alcohol and does not use drugs.   Physical Exam: BP 104/66   Pulse 61   Constitutional:  Alert and oriented, No acute distress. HEENT: [place] AT, moist mucus membranes.  Trachea midline, no masses. Cardiovascular: No clubbing, cyanosis, or edema. Respiratory: Normal respiratory effort, no increased work of breathing. GI: Abdomen is soft, nontender, nondistended, no abdominal masses GU: No CVA tenderness Skin: No rashes, bruises or suspicious lesions. Neurologic: Grossly intact, no focal deficits, moving all 4 extremities. Psychiatric: Normal mood and affect.  Laboratory Data: Lab Results  Component Value Date   WBC 5.5 03/06/2023   HGB 13.6 03/06/2023   HCT 41.8 03/06/2023   MCV 86.4 03/06/2023   PLT 170 03/06/2023    Lab Results  Component Value Date   CREATININE 1.50 (H) 03/06/2023    No results found for: "PSA"  No results found for: "TESTOSTERONE"  No results found for: "HGBA1C"  Urinalysis    Component Value Date/Time   COLORURINE YELLOW 12/30/2012 1825   APPEARANCEUR Clear 02/26/2023 1355   LABSPEC >1.030 (H) 12/30/2012 1825   PHURINE 6.0 12/30/2012 1825   GLUCOSEU Negative 02/26/2023 1355   HGBUR MODERATE (A) 12/30/2012 1825   BILIRUBINUR Negative 02/26/2023 1355   KETONESUR NEGATIVE 12/30/2012 1825   PROTEINUR Negative 02/26/2023 1355   PROTEINUR  NEGATIVE 12/30/2012 1825   UROBILINOGEN 1.0 12/30/2012 1825   NITRITE Negative 02/26/2023 1355   NITRITE NEGATIVE 12/30/2012 1825   LEUKOCYTESUR Negative 02/26/2023 1355    Lab Results  Component Value Date   LABMICR See below: 02/26/2023   WBCUA None seen 02/26/2023   LABEPIT 0-10 02/26/2023   BACTERIA None seen 02/26/2023    Pertinent Imaging: CT - no LAD or bone lesions   Assessment & Plan:    1. Left ureteral stone - check renal US in 6 weeks   - Urinalysis, Routine w reflex microscopic  2. PCa - I had a long discussion with the patient and his wife using his path report as a reference. We went over his stage, grade and prognosis and  the relevant anatomy. We discussed the nature risks and benefits of active surveillance, radical prostatectomy, IMRT (+/- brachytherapy, +/- ADT). We discussed specifically how each treatment might affect the bowel, bladder and sexual function. We discussed how each treatment might effect salvage treatments. All questions answered. He is most interested in radiation possibly brachytherapy.  They will give it some thought and I will refer to Dr. Kathrynn Running to discuss further.  3.  BPH, frequency-has had more frequency and urgency since he removed the stent.  I will continue his tamsulosin.  Ultrasound as above.   No follow-ups on file.  Jerilee Field, MD  [facility]  [address] [place], [place] [address] [phone]

## 2023-03-29 NOTE — Progress Notes (Signed)
GU Location of Tumor / Histology: Prostate Ca  If Prostate Cancer, Gleason Score is (3 + 4) and PSA is (7.4 on 01/08/2023)  PSA 7.3 on 09/2022  Biopsy:  Dr. Jerilee Field October 2024 PSA 7.4 T1c (although I did note a difference between the right and the left side of his prostate although I thought the right side felt more firm. 5/6 biopsies were positive on the left). Prostate 47 g (PSA density 0.16) GG 2 in 4 cores, left 10 to 40% (pattern 4 in 5-20%) GG 1 in  1 core on the left 10% 5/12 Staging: Sep 2024 CT - no LAD or bone lesions   01/29/2023 Dr. Jerilee Field CT Abdomen Pelvis with/without Contrast CLINICAL DATA: Microhematuria.   IMPRESSION: 1. Left ureterovesical junction 7 mm stone. Normal caliber ureters. No hydronephrosis. 2. Layering 10 x 7 mm bladder stone. 3. Bilateral diffuse punctate caliceal calcifications, chronic and not appreciably changed from 12/30/2012 CT, most compatible with medullary nephrocalcinosis. 4. No suspicious renal cortical masses. No evidence of urothelial lesions. 5. Mildly enlarged prostate. 6. Coronary atherosclerosis. 7.  Aortic Atherosclerosis (ICD10-I70.0).   Past/Anticipated interventions by urology, if any:   02/26/2023 Dr. Jerilee Field   Past/Anticipated interventions by medical oncology, if any: NA  Weight changes, if any:  No  IPSS:  15 SHIM:  9  Bowel/Bladder complaints, if any:  No bowel issues just urinary frequency.  Nausea/Vomiting, if any: No  Pain issues, if any:  0/10  SAFETY ISSUES: Prior radiation?  No Pacemaker/ICD?  No Possible current pregnancy? Male Is the patient on methotrexate? No  Current Complaints / other details:

## 2023-04-02 ENCOUNTER — Encounter: Payer: Self-pay | Admitting: Urology

## 2023-04-02 DIAGNOSIS — C61 Malignant neoplasm of prostate: Secondary | ICD-10-CM | POA: Insufficient documentation

## 2023-04-03 ENCOUNTER — Ambulatory Visit
Admission: RE | Admit: 2023-04-03 | Discharge: 2023-04-03 | Disposition: A | Discharge status: Home / Self Care | Payer: Medicare Other | Source: Ambulatory Visit | Attending: Radiation Oncology | Admitting: Radiation Oncology

## 2023-04-03 ENCOUNTER — Encounter: Payer: Self-pay | Admitting: Radiation Oncology

## 2023-04-03 VITALS — BP 111/80 | HR 81 | Temp 97.7°F | Resp 20 | Ht 73.0 in | Wt 189.6 lb

## 2023-04-03 DIAGNOSIS — K219 Gastro-esophageal reflux disease without esophagitis: Secondary | ICD-10-CM | POA: Insufficient documentation

## 2023-04-03 DIAGNOSIS — C61 Malignant neoplasm of prostate: Secondary | ICD-10-CM | POA: Diagnosis present

## 2023-04-03 DIAGNOSIS — E785 Hyperlipidemia, unspecified: Secondary | ICD-10-CM | POA: Insufficient documentation

## 2023-04-03 DIAGNOSIS — Z87442 Personal history of urinary calculi: Secondary | ICD-10-CM | POA: Diagnosis not present

## 2023-04-03 DIAGNOSIS — Z191 Hormone sensitive malignancy status: Secondary | ICD-10-CM | POA: Diagnosis not present

## 2023-04-03 DIAGNOSIS — R3129 Other microscopic hematuria: Secondary | ICD-10-CM | POA: Insufficient documentation

## 2023-04-03 DIAGNOSIS — Z79899 Other long term (current) drug therapy: Secondary | ICD-10-CM | POA: Insufficient documentation

## 2023-04-03 DIAGNOSIS — I10 Essential (primary) hypertension: Secondary | ICD-10-CM | POA: Diagnosis not present

## 2023-04-03 DIAGNOSIS — Z7982 Long term (current) use of aspirin: Secondary | ICD-10-CM | POA: Diagnosis not present

## 2023-04-03 DIAGNOSIS — I700 Atherosclerosis of aorta: Secondary | ICD-10-CM | POA: Diagnosis not present

## 2023-04-03 NOTE — Progress Notes (Signed)
Introduced myself to the patient as the prostate nurse navigator.  No barriers to care identified at this time.  He is here to discuss his radiation treatment options and will proceed with brachytherapy.  I gave him my business card, written education on brachytherapy. I encouraged him to call me with questions or concerns.  Verbalized understanding.

## 2023-04-03 NOTE — Progress Notes (Signed)
Radiation Oncology         [phone] ________________________________  Initial Outpatient Consultation  Name: Leonard Stark MRN: [id number]  Date: 04/03/2023  DOB: 04-Mar-1951  DG:[id number], [name], MD  Jerilee Field, MD   REFERRING PHYSICIAN: Jerilee Field, MD  DIAGNOSIS: 72 y.o. gentleman with Stage T1c adenocarcinoma of the prostate with Gleason score of 3+4, and PSA of 7.4.    ICD-10-CM   1. Malignant neoplasm of prostate (HCC)  C61       HISTORY OF PRESENT ILLNESS: Leonard Stark is a 72 y.o. male with a diagnosis of prostate cancer. He was noted to have an elevated PSA of 7.3 by his primary care physician, Dr. Phillips Odor.  Accordingly, he was referred for evaluation in urology by Dr. Mena Goes on 01/08/23,  digital rectal examination performed at that time showed no nodules or induration. He was also found to have microscopic hematuria at that visit and a CT A/P showed a 7 mm left ureteral stone at the UVJ. A repeat PSA obtained that day showed stable elevation at 7.4. The patient proceeded to transrectal ultrasound with 12 biopsies of the prostate on 03/13/23, along with cystoscopy, ureteroscopy, laser lithotripsy and stent placement.  The prostate volume measured 46.87 cc.  Out of 12 core biopsies, 5 were positive, all on the left side.  The maximum Gleason score was 3+4, and this was seen in the base, mid lateral, mid, and apex. Additionally, Gleason 3+3 was seen in the left apex lateral.  The patient reviewed the biopsy results with his urologist and he has kindly been referred today for discussion of potential radiation treatment options.   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  Acute appendicitis with perforation on path as well.   CARDIAC CATHETERIZATION  2011   COLONOSCOPY  10/13/2011   benign polyps, poor prep compromised exam, next TCS 7 years.    COLONOSCOPY N/A 11/09/2017   Dr. Jena Gauss: Tortuous colon, melanosis coli.  Repeat colonoscopy in 10 years.   CYSTOSCOPY/URETEROSCOPY/HOLMIUM LASER/STENT PLACEMENT Left 03/13/2023   Procedure: CYSTOSCOPY LEFT RETROGRADE PYELOGRAM LEFT URETEROSCOPY/HOLMIUM LASER/STENT PLACEMENT;  Surgeon: Jerilee Field, MD;  Location: [facility];  Service: Urology;  Laterality: Left;   ESOPHAGOGASTRODUODENOSCOPY N/A 11/09/2017   Dr. Jena Gauss: Small hiatal hernia, normal esophagus status post dilation for dysphagia.   MALONEY DILATION N/A 11/09/2017   Procedure: Elease Hashimoto DILATION;  Surgeon: Corbin Ade, MD;  Location: [facility];  Service: Endoscopy;  Laterality: N/A;   PROSTATE BIOPSY N/A 03/13/2023   Procedure: BIOPSY TRANSRECTAL ULTRASONIC PROSTATE (TUBP);  Surgeon: Jerilee Field, MD;  Location: [facility];  Service: Urology;  Laterality: N/A;  90 MINS FOR CASE    FAMILY HISTORY:  Family History  Problem Relation Age of Onset   Stroke Mother    Hypertension Sister    Diabetes Sister    Colon cancer Neg Hx     SOCIAL HISTORY:  Social History  Socioeconomic History   Marital status: Single    Spouse name: Not on file   Number of children: Not on file   Years of education: Not on file   Highest education level: Not on file  Occupational History   Not on file  Tobacco Use   Smoking status: Former   Smokeless tobacco: Never  Vaping Use   Vaping status: Never Used  Substance and Sexual Activity   Alcohol use: Never    Alcohol/week: 1.0 standard drink of alcohol    Types: 1 Standard  drinks or equivalent per week   Drug use: No   Sexual activity: Not on file  Other Topics Concern   Not on file  Social History Narrative   Not on file   Social Determinants of Health   Financial Resource Strain: Not on file  Food Insecurity: No Food Insecurity (04/03/2023)   Hunger Vital Sign    Worried About Running Out of Food in the Last Year: Never true    Ran Out of Food in the Last Year: Never true  Transportation Needs: No Transportation Needs (04/03/2023)   PRAPARE - Administrator, Civil Service (Medical): No    Lack of Transportation (Non-Medical): No  Physical Activity: Not on file  Stress: Not on file  Social Connections: Not on file  Intimate Partner Violence: Not At Risk (04/03/2023)   Humiliation, Afraid, Rape, and Kick questionnaire    Fear of Current or Ex-Partner: No    Emotionally Abused: No    Physically Abused: No    Sexually Abused: No    ALLERGIES: Patient has no known allergies.  MEDICATIONS:  Current Outpatient Medications  Medication Sig Dispense Refill   ondansetron (ZOFRAN-ODT) 4 MG disintegrating tablet Take by mouth.     oxyCODONE-acetaminophen (PERCOCET/ROXICET) 5-325 MG tablet Take 2 tablets by mouth every 8 (eight) hours as needed.     acetaminophen (TYLENOL) 500 MG tablet Take 1,000 mg by mouth every 6 (six) hours as needed for moderate pain.     aspirin EC 81 MG tablet Take 81 mg by mouth daily. Swallow whole.     atenolol (TENORMIN) 50 MG tablet Take 50 mg by mouth every morning.     clonazePAM (KLONOPIN) 0.5 MG tablet Take 0.5 mg by mouth 2 (two) times daily as needed for anxiety.     Coenzyme Q10 (COQ10) 100 MG CAPS Take 100 mg by mouth daily.     linaclotide (LINZESS) 290 MCG CAPS capsule Take 290 mcg by mouth daily.     Omega-3 Fatty Acids (FISH OIL) 1000 MG CAPS Take 1,000 mg by mouth daily.     oxybutynin (DITROPAN XL) 15 MG 24 hr tablet Take 15 mg by mouth daily.     pantoprazole (PROTONIX) 40 MG tablet TAKE 1 TABLET  TWICE A DAY BEFORE MEALS 180 tablet 3   pravastatin (PRAVACHOL) 40 MG tablet Take 40 mg by mouth every morning.     tamsulosin (FLOMAX) 0.4 MG CAPS capsule Take 1 capsule (0.4 mg total) by mouth daily after supper. 90 capsule 3   valsartan-hydrochlorothiazide (DIOVAN-HCT) 320-25 MG tablet Take 1 tablet by mouth daily.     No current facility-administered medications for this encounter.   Facility-Administered Medications Ordered in Other Encounters  Medication Dose Route Frequency Provider Last Rate Last Admin   sodium phosphate (FLEET) enema 1 enema  1 enema Rectal Once Jerilee Field, MD        REVIEW OF SYSTEMS:  On review of  systems, the patient reports that he is doing well overall. He denies any chest pain, shortness of breath, cough, fevers, chills, night sweats, unintended weight changes. He denies any bowel disturbances, and denies abdominal pain, nausea or vomiting. He denies any new musculoskeletal or joint aches or pains. His IPSS was 15, indicating moderate urinary symptoms. He reports his main complaint is urinary frequency/urgency. His SHIM was 9, indicating he has moderate erectile dysfunction. A complete review of systems is obtained and is otherwise negative.    PHYSICAL EXAM:  Wt Readings from Last 3 Encounters:  04/03/23 189 lb 9.6 oz (86 kg)  03/13/23 190 lb 3.2 oz (86.3 kg)  08/24/21 191 lb 3.2 oz (86.7 kg)   Temp Readings from Last 3 Encounters:  04/03/23 97.7 F (36.5 C)  03/13/23 (!) 97.5 F (36.4 C)  03/13/23 97.8 F (36.6 C) (Oral)   BP Readings from Last 3 Encounters:  04/03/23 111/80  03/26/23 104/66  03/13/23 116/76   Pulse Readings from Last 3 Encounters:  04/03/23 81  03/26/23 61  03/13/23 (!) 55   Pain Assessment Pain Score: 0-No pain/10  In general this is a well appearing African American male in no acute distress. He's alert and oriented x4 and appropriate throughout the examination. Cardiopulmonary assessment is negative for acute  distress, and he exhibits normal effort.     KPS = 100  100 - Normal; no complaints; no evidence of disease. 90   - Able to carry on normal activity; minor signs or symptoms of disease. 80   - Normal activity with effort; some signs or symptoms of disease. 63   - Cares for self; unable to carry on normal activity or to do active work. 60   - Requires occasional assistance, but is able to care for most of his personal needs. 50   - Requires considerable assistance and frequent medical care. 40   - Disabled; requires special care and assistance. 30   - Severely disabled; hospital admission is indicated although death not imminent. 20   - Very sick; hospital admission necessary; active supportive treatment necessary. 10   - Moribund; fatal processes progressing rapidly. 0     - Dead  Karnofsky DA, Abelmann WH, Craver LS and Burchenal JH [phone]) The use of the nitrogen mustards in the palliative treatment of carcinoma: with particular reference to bronchogenic carcinoma Cancer 1 634-56  LABORATORY DATA:  Lab Results  Component Value Date   WBC 5.5 03/06/2023   HGB 13.6 03/06/2023   HCT 41.8 03/06/2023   MCV 86.4 03/06/2023   PLT 170 03/06/2023   Lab Results  Component Value Date   NA 136 03/06/2023   K 3.4 (L) 03/06/2023   CL 105 03/06/2023   CO2 19 (L) 03/06/2023   Lab Results  Component Value Date   ALT 22 12/30/2012   AST 22 12/30/2012   ALKPHOS 57 12/30/2012   BILITOT 0.5 12/30/2012     RADIOGRAPHY: [place] Transrectal Complete  Result Date: 03/13/2023 Please see Notes tab for imaging impression.  US Guided Needle Placement  Result Date: 03/13/2023 CLINICAL DATA:  Ultrasound was provided for use by the ordering physician.  No provider Interpretation or professional fees incurred.    [place] Intraoperative  Result Date: 03/13/2023 CLINICAL DATA:  Ultrasound was provided for use by the ordering physician.  No provider Interpretation or professional fees incurred.    [place]  PROSTATE BIOPSY MULTIPLE  Result Date: 03/13/2023 Please see Notes tab for imaging impression.  DG  C-Arm 1-60 Min-No Report  Result Date: 03/13/2023 Fluoroscopy was utilized by the requesting physician.  No radiographic interpretation.      IMPRESSION/PLAN: 1. 73 y.o. gentleman with Stage T1c adenocarcinoma of the prostate with Gleason Score of 3+4, and PSA of 7.4. We discussed the patient's workup and outlined the nature of prostate cancer in this setting. The patient's T stage, Gleason's score, and PSA put him into the favorable intermediate risk group. Accordingly, he is eligible for a variety of potential treatment options including brachytherapy, 5.5 weeks of external radiation, or prostatectomy. We discussed the available radiation techniques, and focused on the details and logistics of delivery. We discussed and outlined the risks, benefits, short and long-term effects associated with radiotherapy and compared and contrasted these with prostatectomy. We discussed the role of SpaceOAR gel in reducing the rectal toxicity associated with radiotherapy.  He appears to have a good understanding of his disease and our treatment recommendations which are of curative intent.  He was encouraged to ask questions that were answered to his stated satisfaction.   At the conclusion of our conversation, the patient is interested in moving forward with brachytherapy and use of SpaceOAR gel to reduce rectal toxicity from radiotherapy.  We will share our discussion with Dr. Mena Goes and move forward with scheduling his CT [facility] planning appointment in the near future.  The patient met briefly with Darryl Nestle in our office who will be working closely with him to coordinate OR scheduling and pre and post procedure appointments.  We will contact the pharmaceutical rep to ensure that SpaceOAR is available at the time of procedure.  We enjoyed meeting him today and look forward to continuing to participate in his  care.  We personally spent 70 minutes in this encounter including chart review, reviewing radiological studies, meeting face-to-face with the patient, entering orders and completing documentation.    Marguarite Arbour, PA-C    Margaretmary Dys, MD  [facility]  Radiation Oncology Direct Dial: [phone]  Fax: [phone] [email or web address]  Skype  LinkedIn   This document serves as a record of services personally performed by Margaretmary Dys, MD and Marcello Fennel, PA-C. It was created on their behalf by Mickie Bail, a trained medical scribe. The creation of this record is based on the scribe's personal observations and the provider's statements to them. This document has been checked and approved by the attending provider.

## 2023-04-04 ENCOUNTER — Other Ambulatory Visit: Payer: Self-pay | Admitting: Urology

## 2023-04-04 ENCOUNTER — Telehealth: Payer: Self-pay | Admitting: *Deleted

## 2023-04-04 DIAGNOSIS — Z191 Hormone sensitive malignancy status: Secondary | ICD-10-CM | POA: Diagnosis not present

## 2023-04-04 NOTE — Telephone Encounter (Signed)
CALLED PATIENT TO INFORM OF PRE-SEED APPTS. AND IMPLANT DATE, LVM FOR A RETURN CALL 

## 2023-05-03 ENCOUNTER — Ambulatory Visit (HOSPITAL_COMMUNITY): Admission: RE | Admit: 2023-05-03 | Payer: Medicare Other | Source: Ambulatory Visit

## 2023-05-22 ENCOUNTER — Telehealth: Payer: Self-pay | Admitting: *Deleted

## 2023-05-22 NOTE — Telephone Encounter (Signed)
Called patient to remind of pre-seed appts. for 05-24-23- arrival time- 9:45 am @ [facility], spoke with patient and he is aware of these appts.

## 2023-05-23 NOTE — Progress Notes (Signed)
  Radiation Oncology         [phone] ________________________________  Name: Leonard Stark MRN: [id number]  Date: 05/24/2023  DOB: 09-10-1950  SIMULATION AND TREATMENT PLANNING NOTE PUBIC ARCH STUDY  RR:Hnoipwh, Norleen, MD  Nieves Cough, MD  DIAGNOSIS: 73 y.o. gentleman with Stage T1c adenocarcinoma of the prostate with Gleason score of 3+4, and PSA of 7.4.   Oncology History  Malignant neoplasm of prostate (HCC)  03/13/2023 Cancer Staging   Staging form: Prostate, AJCC 8th Edition - Clinical stage from 03/13/2023: Stage IIB (cT1c, cN0, cM0, PSA: 7.4, Grade Group: 2) - Signed by Sherwood Rise, PA-C on 04/02/2023 Histopathologic type: Adenocarcinoma, NOS Stage prefix: Initial diagnosis Prostate specific antigen (PSA) range: Less than 10 Gleason primary pattern: 3 Gleason secondary pattern: 4 Gleason score: 7 Histologic grading system: 5 grade system Number of biopsy cores examined: 12 Number of biopsy cores positive: 5 Location of positive needle core biopsies: One side   04/02/2023 Initial Diagnosis   Malignant neoplasm of prostate (HCC)     No diagnosis found.  COMPLEX SIMULATION:  The patient presented today for evaluation for possible prostate seed implant. He was brought to the radiation planning suite and placed supine on the CT couch. A 3-dimensional image study set was obtained in upload to the planning computer. There, on each axial slice, I contoured the prostate gland. Then, using three-dimensional radiation planning tools I reconstructed the prostate in view of the structures from the transperineal needle pathway to assess for possible pubic arch interference. In doing so, I did not appreciate any pubic arch interference. Also, the patient's prostate volume was estimated based on the drawn structure. The volume was 46 cc.  Given the pubic arch appearance and prostate volume, patient remains a good candidate to proceed with prostate seed implant. Today, he freely  provided informed written consent to proceed.    PLAN: The patient will undergo prostate seed implant.   ________________________________  Cough LABOR. Patrcia, M.D.

## 2023-05-23 NOTE — Progress Notes (Signed)
 Radiation Oncology         [phone] ________________________________  Outpatient Follow up- Pre-seed visit  Name: Leonard Stark MRN: [id number]  Date: 05/24/2023  DOB: 1950-11-18  RR:Hnoipwh, Norleen, MD  Nieves Cough, MD   REFERRING PHYSICIAN: Nieves Cough, MD  DIAGNOSIS: 73 y.o. gentleman with Stage T1c adenocarcinoma of the prostate with Gleason score of 3+4, and PSA of 7.4.     ICD-10-CM   1. Malignant neoplasm of prostate (HCC)  C61       HISTORY OF PRESENT ILLNESS: Leonard Stark is a 73 y.o. male with a diagnosis of prostate cancer. He was noted to have an elevated PSA of 7.3 by his primary care physician, Dr. Marvine.  Accordingly, he was referred for evaluation in urology by Dr. Nieves on 01/08/23,  digital rectal examination performed at that time showed no nodules or induration. He was also found to have microscopic hematuria at that visit and a CT A/P showed a 7 mm left ureteral stone at the UVJ. A repeat PSA obtained that day showed stable elevation at 7.4. The patient proceeded to transrectal ultrasound with 12 biopsies of the prostate on 03/13/23, along with cystoscopy, ureteroscopy, laser lithotripsy and stent placement.  The prostate volume measured 46.87 cc.  Out of 12 core biopsies, 5 were positive, all on the left side.  The maximum Gleason score was 3+4, and this was seen in the base, mid lateral, mid, and apex. Additionally, Gleason 3+3 was seen in the left apex lateral.   The patient reviewed the biopsy results with his urologist and was kindly referred to us  for discussion of potential radiation treatment options. We initially met the patient on 04/03/23 and he was most interested in proceeding with brachytherapy and SpaceOAR gel placement for treatment of his disease. He is here today for his pre-procedure imaging for planning and to answer any additional questions he may have about this treatment.   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  Acute appendicitis with perforation on path as well.   CARDIAC CATHETERIZATION  2011   COLONOSCOPY  10/13/2011   benign polyps, poor prep compromised exam, next TCS 7 years.    COLONOSCOPY N/A 11/09/2017   Dr. Shaaron: Tortuous colon, melanosis coli.  Repeat colonoscopy in 10 years.   CYSTOSCOPY/URETEROSCOPY/HOLMIUM LASER/STENT PLACEMENT Left 03/13/2023   Procedure: CYSTOSCOPY LEFT RETROGRADE PYELOGRAM LEFT URETEROSCOPY/HOLMIUM LASER/STENT PLACEMENT;  Surgeon: Nieves Cough, MD;  Location: [facility];  Service: Urology;  Laterality: Left;   ESOPHAGOGASTRODUODENOSCOPY N/A 11/09/2017   Dr. Shaaron: Small hiatal hernia, normal esophagus status post dilation for dysphagia.   MALONEY DILATION N/A 11/09/2017   Procedure: AGAPITO DILATION;  Surgeon: Shaaron Lamar HERO, MD;  Location: [facility];  Service: Endoscopy;  Laterality: N/A;   PROSTATE BIOPSY N/A 03/13/2023   Procedure: BIOPSY TRANSRECTAL ULTRASONIC PROSTATE (TUBP);  Surgeon: Nieves Cough, MD;  Location: [facility];  Service: Urology;  Laterality: N/A;  90 MINS FOR CASE    FAMILY HISTORY:  Family History  Problem Relation Age of Onset   Stroke Mother    Hypertension Sister    Diabetes Sister    Colon cancer Neg Hx     SOCIAL HISTORY:  Social History   Socioeconomic History   Marital status: Single    Spouse name: Not on file   Number of children: Not on file   Years of education: Not on file   Highest education level: Not  on file  Occupational History   Not on file  Tobacco Use   Smoking status: Former   Smokeless tobacco: Never  Vaping Use   Vaping status: Never Used  Substance and Sexual Activity   Alcohol use: Never    Alcohol/week: 1.0 standard drink of alcohol    Types: 1 Standard drinks or equivalent per week   Drug use: No   Sexual activity: Not on file  Other Topics Concern   Not on file  Social History Narrative   Not on file   Social Drivers of Health   Financial Resource Strain: Not on file  Food Insecurity: No Food Insecurity (04/03/2023)   Hunger Vital Sign    Worried About Running Out of Food in the Last Year: Never true    Ran Out of Food in the Last Year: Never true  Transportation Needs: No Transportation Needs (04/03/2023)   PRAPARE - Administrator, Civil Service (Medical): No    Lack of Transportation (Non-Medical): No  Physical Activity: Not on file  Stress: Not on file  Social Connections: Not on file  Intimate Partner Violence: Not At Risk (04/03/2023)   Humiliation, Afraid, Rape, and Kick questionnaire    Fear of Current or Ex-Partner: No    Emotionally Abused: No    Physically Abused: No    Sexually Abused: No    ALLERGIES: Patient has no known allergies.  MEDICATIONS:  Current Outpatient Medications  Medication Sig Dispense Refill   acetaminophen  (TYLENOL ) 500 MG tablet Take 1,000 mg by mouth every 6 (six) hours as needed for moderate pain.     aspirin EC 81 MG tablet Take 81 mg by mouth daily. Swallow whole.     atenolol (TENORMIN) 50 MG tablet Take 50 mg by mouth every morning.     clonazePAM (KLONOPIN) 0.5 MG tablet Take 0.5 mg by mouth 2 (two) times daily as needed for anxiety.     Coenzyme Q10 (COQ10) 100 MG CAPS Take 100 mg by mouth daily.     linaclotide  (LINZESS ) 290 MCG CAPS capsule Take 290 mcg by mouth daily.     Omega-3 Fatty Acids (FISH OIL) 1000 MG CAPS Take 1,000 mg by mouth daily.     ondansetron  (ZOFRAN -ODT) 4 MG disintegrating  tablet Take by mouth.     oxybutynin (DITROPAN XL) 15 MG 24 hr tablet Take 15 mg by mouth daily.     oxyCODONE -acetaminophen  (PERCOCET/ROXICET) 5-325 MG tablet Take 2 tablets by mouth every 8 (eight) hours as needed.     pantoprazole  (PROTONIX ) 40 MG tablet TAKE 1 TABLET TWICE A DAY BEFORE MEALS 180 tablet 3   pravastatin (PRAVACHOL) 40 MG tablet Take 40 mg by mouth every morning.     tamsulosin  (FLOMAX ) 0.4 MG CAPS capsule Take 1 capsule (0.4 mg total) by mouth daily after supper. 90 capsule 3   valsartan-hydrochlorothiazide (DIOVAN-HCT) 320-25 MG tablet Take 1 tablet by mouth daily.     No current facility-administered medications for  this visit.   Facility-Administered Medications Ordered in Other Visits  Medication Dose Route Frequency Provider Last Rate Last Admin   sodium phosphate  (FLEET) enema 1 enema  1 enema Rectal Once Nieves Cough, MD        REVIEW OF SYSTEMS:  On review of systems, the patient reports that he is doing well overall. He denies any chest pain, shortness of breath, cough, fevers, chills, night sweats, unintended weight changes. He denies any bowel disturbances, and denies abdominal pain, nausea or vomiting. He denies any new musculoskeletal or joint aches or pains. His IPSS was 15, indicating moderate urinary symptoms. He reports his main complaint is urinary frequency/urgency. His SHIM was 9, indicating he has moderate erectile dysfunction. A complete review of systems is obtained and is otherwise negative.     PHYSICAL EXAM:  Wt Readings from Last 3 Encounters:  04/03/23 189 lb 9.6 oz (86 kg)  03/13/23 190 lb 3.2 oz (86.3 kg)  08/24/21 191 lb 3.2 oz (86.7 kg)   Temp Readings from Last 3 Encounters:  04/03/23 97.7 F (36.5 C)  03/13/23 (!) 97.5 F (36.4 C)  03/13/23 97.8 F (36.6 C) (Oral)   BP Readings from Last 3 Encounters:  04/03/23 111/80  03/26/23 104/66  03/13/23 116/76   Pulse Readings from Last 3 Encounters:  04/03/23 81  03/26/23 61   03/13/23 (!) 55    /10  In general this is a well appearing African American male in no acute distress. He's alert and oriented x4 and appropriate throughout the examination. Cardiopulmonary assessment is negative for acute distress, and he exhibits normal effort.     KPS = 100  100 - Normal; no complaints; no evidence of disease. 90   - Able to carry on normal activity; minor signs or symptoms of disease. 80   - Normal activity with effort; some signs or symptoms of disease. 58   - Cares for self; unable to carry on normal activity or to do active work. 60   - Requires occasional assistance, but is able to care for most of his personal needs. 50   - Requires considerable assistance and frequent medical care. 40   - Disabled; requires special care and assistance. 30   - Severely disabled; hospital admission is indicated although death not imminent. 20   - Very sick; hospital admission necessary; active supportive treatment necessary. 10   - Moribund; fatal processes progressing rapidly. 0     - Dead  Karnofsky DA, Abelmann WH, Craver LS and Burchenal [facility] [phone]) The use of the nitrogen mustards in the palliative treatment of carcinoma: with particular reference to bronchogenic carcinoma Cancer 1 [id number]  LABORATORY DATA:  Lab Results  Component Value Date   WBC 5.5 03/06/2023   HGB 13.6 03/06/2023   HCT 41.8 03/06/2023   MCV 86.4 03/06/2023   PLT 170 03/06/2023   Lab Results  Component Value Date   NA 136 03/06/2023   K 3.4 (L) 03/06/2023   CL 105 03/06/2023   CO2 19 (L) 03/06/2023   Lab Results  Component Value Date   ALT 22 12/30/2012   AST 22 12/30/2012   ALKPHOS 57 12/30/2012   BILITOT 0.5 12/30/2012     RADIOGRAPHY: No results found.    IMPRESSION/PLAN: 1. 73 y.o. gentleman with Stage T1c adenocarcinoma of the prostate with Gleason score of 3+4, and PSA of 7.4.  The patient has elected to proceed with seed implant for treatment of his disease. We reviewed the  risks, benefits, short and long-term effects associated with brachytherapy and discussed the role of SpaceOAR in reducing the rectal toxicity associated with radiotherapy.  He appears to have a good understanding of his disease and our treatment recommendations which are of curative intent.  He was encouraged to ask questions that were answered to his stated satisfaction. He has freely signed written consent to proceed today in the office and a copy of this document will be placed in his medical record. His procedure is tentatively scheduled for 06/15/23 in collaboration with Dr. Nieves and we will see him back for his post-procedure visit approximately 3 weeks thereafter. We look forward to continuing to participate in his care. He knows that he is welcome to call with any questions or concerns at any time in the interim.  I personally spent 30 minutes in this encounter including chart review, reviewing radiological studies, meeting face-to-face with the patient, entering orders and completing documentation.    Sabra MICAEL Rusk, MMS, PA-C [facility] Radiation Oncology Physician Assistant Direct Dial: [phone]  Fax: [phone]

## 2023-05-24 ENCOUNTER — Ambulatory Visit
Admission: RE | Admit: 2023-05-24 | Discharge: 2023-05-24 | Disposition: A | Discharge status: Home / Self Care | Payer: Medicare Other | Source: Ambulatory Visit | Attending: Urology | Admitting: Urology

## 2023-05-24 ENCOUNTER — Ambulatory Visit
Admission: RE | Admit: 2023-05-24 | Discharge: 2023-05-24 | Disposition: A | Discharge status: Home / Self Care | Payer: Medicare Other | Source: Ambulatory Visit | Attending: Radiation Oncology | Admitting: Radiation Oncology

## 2023-05-24 ENCOUNTER — Encounter: Payer: Self-pay | Admitting: Urology

## 2023-05-24 VITALS — Resp 20 | Ht 73.0 in | Wt 187.0 lb

## 2023-05-24 DIAGNOSIS — C61 Malignant neoplasm of prostate: Secondary | ICD-10-CM | POA: Diagnosis present

## 2023-05-24 DIAGNOSIS — Z191 Hormone sensitive malignancy status: Secondary | ICD-10-CM | POA: Diagnosis not present

## 2023-05-24 NOTE — Progress Notes (Signed)
 Pre-seed nursing interview for a diagnosis of Stage T1c adenocarcinoma of the prostate with Gleason score of 3+4, and PSA of 7.4.  Patient identity verified x2.   Patient reports mild dysuria 2/10. No other related issues conveyed at this time.  Meaningful use complete.  Urinary Management medication(s)- Tamsulosin  Urology appointment date- Pending, with Dr. Nieves at [facility]  Resp 20   Ht 6' 1 (1.854 m)   Wt 187 lb (84.8 kg)   BMI 24.67 kg/m   This concludes the interaction.  Leonard Minerva, LPN

## 2023-05-28 ENCOUNTER — Ambulatory Visit: Payer: Medicare Other | Admitting: Urology

## 2023-06-08 ENCOUNTER — Telehealth: Payer: Self-pay | Admitting: *Deleted

## 2023-06-08 NOTE — Telephone Encounter (Signed)
Returned patient's phone call, spoke with patient's significant other

## 2023-06-12 NOTE — Progress Notes (Signed)
RN spoke with patient to assess any questions or concerns prior to upcoming brachytherapy on 1/24.  All questions answered, no additional needs at this time.

## 2023-06-14 ENCOUNTER — Telehealth: Payer: Self-pay | Admitting: *Deleted

## 2023-06-14 ENCOUNTER — Encounter (HOSPITAL_BASED_OUTPATIENT_CLINIC_OR_DEPARTMENT_OTHER): Payer: Self-pay | Admitting: Urology

## 2023-06-14 NOTE — Progress Notes (Addendum)
Spoke w/ via phone for pre-op interview--- pt Lab needs dos----  [name] results------ current EKG in epic/ chart  COVID test -----patient states asymptomatic no test needed Arrive at ------- 1100 on 06-15-2023 NPO after MN NO Solid Food.  Clear liquids from MN until--- 1000 Med rec completed Medications to take morning of surgery ----- atenolol, protonix, if needed may take klonopin Diabetic medication ----- n/a Patient instructed no nail polish to be worn day of surgery Patient instructed to bring photo id and insurance card day of surgery Patient aware to have Driver (ride ) / caregiver    for 24 hours after surgery - sig other, lauritz Patient Special Instructions ----- will do one fleet enema morning of surgery Pre-Op special Instructions ----- n/a Patient verbalized understanding of instructions that were given at this phone interview. Patient denies chest pain, sob, fever, cough at the interview.

## 2023-06-14 NOTE — Telephone Encounter (Signed)
Called patient to remind of procedure for 06-15-23, spoke with patient and he is aware of this procedure

## 2023-06-15 ENCOUNTER — Encounter (HOSPITAL_BASED_OUTPATIENT_CLINIC_OR_DEPARTMENT_OTHER): Payer: Self-pay | Admitting: Urology

## 2023-06-15 ENCOUNTER — Encounter (HOSPITAL_BASED_OUTPATIENT_CLINIC_OR_DEPARTMENT_OTHER)
Admission: RE | Disposition: A | Discharge status: Home / Self Care | Payer: Self-pay | Source: Ambulatory Visit | Attending: Urology

## 2023-06-15 ENCOUNTER — Ambulatory Visit (HOSPITAL_BASED_OUTPATIENT_CLINIC_OR_DEPARTMENT_OTHER): Payer: Medicare Other | Admitting: Anesthesiology

## 2023-06-15 ENCOUNTER — Other Ambulatory Visit: Payer: Self-pay

## 2023-06-15 ENCOUNTER — Ambulatory Visit (HOSPITAL_COMMUNITY): Payer: Medicare Other

## 2023-06-15 ENCOUNTER — Ambulatory Visit (HOSPITAL_BASED_OUTPATIENT_CLINIC_OR_DEPARTMENT_OTHER)
Admission: RE | Admit: 2023-06-15 | Discharge: 2023-06-15 | Disposition: A | Discharge status: Home / Self Care | Payer: Medicare Other | Source: Ambulatory Visit | Attending: Urology | Admitting: Urology

## 2023-06-15 DIAGNOSIS — I10 Essential (primary) hypertension: Secondary | ICD-10-CM

## 2023-06-15 DIAGNOSIS — N183 Chronic kidney disease, stage 3 unspecified: Secondary | ICD-10-CM | POA: Diagnosis not present

## 2023-06-15 DIAGNOSIS — I2510 Atherosclerotic heart disease of native coronary artery without angina pectoris: Secondary | ICD-10-CM

## 2023-06-15 DIAGNOSIS — Z191 Hormone sensitive malignancy status: Secondary | ICD-10-CM | POA: Diagnosis not present

## 2023-06-15 DIAGNOSIS — Z87442 Personal history of urinary calculi: Secondary | ICD-10-CM | POA: Diagnosis not present

## 2023-06-15 DIAGNOSIS — I129 Hypertensive chronic kidney disease with stage 1 through stage 4 chronic kidney disease, or unspecified chronic kidney disease: Secondary | ICD-10-CM | POA: Diagnosis not present

## 2023-06-15 DIAGNOSIS — C61 Malignant neoplasm of prostate: Secondary | ICD-10-CM

## 2023-06-15 DIAGNOSIS — Z01818 Encounter for other preprocedural examination: Secondary | ICD-10-CM

## 2023-06-15 DIAGNOSIS — Z87891 Personal history of nicotine dependence: Secondary | ICD-10-CM | POA: Insufficient documentation

## 2023-06-15 DIAGNOSIS — K219 Gastro-esophageal reflux disease without esophagitis: Secondary | ICD-10-CM | POA: Diagnosis not present

## 2023-06-15 HISTORY — DX: Diaphragmatic hernia without obstruction or gangrene: K44.9

## 2023-06-15 HISTORY — DX: Personal history of other diseases of the circulatory system: Z86.79

## 2023-06-15 HISTORY — DX: Chronic idiopathic constipation: K59.04

## 2023-06-15 HISTORY — DX: Personal history of urinary calculi: Z87.442

## 2023-06-15 HISTORY — DX: Chronic kidney disease, stage 3 unspecified: N18.30

## 2023-06-15 HISTORY — DX: Generalized anxiety disorder: F41.1

## 2023-06-15 HISTORY — DX: Benign prostatic hyperplasia with lower urinary tract symptoms: N40.1

## 2023-06-15 HISTORY — PX: SPACE OAR INSTILLATION: SHX6769

## 2023-06-15 HISTORY — PX: RADIOACTIVE SEED IMPLANT: SHX5150

## 2023-06-15 LAB — POCT I-STAT, CHEM 8
BUN: 21 mg/dL (ref 8–23)
Calcium, Ion: 1.24 mmol/L (ref 1.15–1.40)
Chloride: 106 mmol/L (ref 98–111)
Creatinine, Ser: 1.3 mg/dL — ABNORMAL HIGH (ref 0.61–1.24)
Glucose, Bld: 84 mg/dL (ref 70–99)
HCT: 43 % (ref 39.0–52.0)
Hemoglobin: 14.6 g/dL (ref 13.0–17.0)
Potassium: 4.1 mmol/L (ref 3.5–5.1)
Sodium: 142 mmol/L (ref 135–145)
TCO2: 24 mmol/L (ref 22–32)

## 2023-06-15 SURGERY — INSERTION, RADIATION SOURCE, PROSTATE
Anesthesia: General

## 2023-06-15 MED ORDER — FENTANYL CITRATE (PF) 100 MCG/2ML IJ SOLN: 25.0000 ug | INTRAMUSCULAR | Status: DC | PRN

## 2023-06-15 MED ORDER — OXYCODONE HCL 5 MG/5ML PO SOLN
5.0000 mg | Freq: Once | ORAL | Status: DC | PRN
Start: 2023-06-15 — End: 2023-06-15

## 2023-06-15 MED ORDER — HYDROCODONE-ACETAMINOPHEN 7.5-325 MG PO TABS: 1.0000 | ORAL_TABLET | Freq: Four times a day (QID) | ORAL | 0 refills | Status: DC | PRN

## 2023-06-15 MED ORDER — DEXAMETHASONE SODIUM PHOSPHATE 4 MG/ML IJ SOLN
INTRAMUSCULAR | Status: DC | PRN
  Administered 2023-06-15: 5 mg via INTRAVENOUS

## 2023-06-15 MED ORDER — ROCURONIUM BROMIDE 10 MG/ML (PF) SYRINGE
PREFILLED_SYRINGE | INTRAVENOUS | Status: AC
Start: 2023-06-15 — End: ?
  Filled 2023-06-15: qty 10

## 2023-06-15 MED ORDER — PHENYLEPHRINE HCL (PRESSORS) 10 MG/ML IV SOLN
INTRAVENOUS | Status: DC | PRN
  Administered 2023-06-15 (×3): 80 ug via INTRAVENOUS

## 2023-06-15 MED ORDER — PROPOFOL 10 MG/ML IV BOLUS
INTRAVENOUS | Status: AC
  Filled 2023-06-15: qty 20

## 2023-06-15 MED ORDER — ONDANSETRON HCL 4 MG/2ML IJ SOLN
INTRAMUSCULAR | Status: DC | PRN
  Administered 2023-06-15: 4 mg via INTRAVENOUS

## 2023-06-15 MED ORDER — AMISULPRIDE (ANTIEMETIC) 5 MG/2ML IV SOLN
INTRAVENOUS | Status: AC
Start: 2023-06-15 — End: ?
  Filled 2023-06-15: qty 4

## 2023-06-15 MED ORDER — FENTANYL CITRATE (PF) 100 MCG/2ML IJ SOLN
INTRAMUSCULAR | Status: AC
  Filled 2023-06-15: qty 2

## 2023-06-15 MED ORDER — ROCURONIUM BROMIDE 100 MG/10ML IV SOLN
INTRAVENOUS | Status: DC | PRN
  Administered 2023-06-15: 10 mg via INTRAVENOUS
  Administered 2023-06-15: 70 mg via INTRAVENOUS

## 2023-06-15 MED ORDER — CIPROFLOXACIN IN D5W 400 MG/200ML IV SOLN
400.0000 mg | INTRAVENOUS | Status: AC
  Administered 2023-06-15: 400 mg via INTRAVENOUS

## 2023-06-15 MED ORDER — PROPOFOL 10 MG/ML IV BOLUS
INTRAVENOUS | Status: DC | PRN
  Administered 2023-06-15: 40 mg via INTRAVENOUS
  Administered 2023-06-15: 160 mg via INTRAVENOUS

## 2023-06-15 MED ORDER — SODIUM CHLORIDE FLUSH 0.9 % IV SOLN
INTRAVENOUS | Status: DC | PRN
  Administered 2023-06-15: 10 mL via INTRAVENOUS

## 2023-06-15 MED ORDER — ACETAMINOPHEN 500 MG PO TABS
1000.0000 mg | ORAL_TABLET | Freq: Once | ORAL | Status: AC
  Administered 2023-06-15: 1000 mg via ORAL

## 2023-06-15 MED ORDER — CEPHALEXIN 500 MG PO CAPS: 500.0000 mg | ORAL_CAPSULE | Freq: Two times a day (BID) | ORAL | 0 refills | Status: DC

## 2023-06-15 MED ORDER — AMISULPRIDE (ANTIEMETIC) 5 MG/2ML IV SOLN
10.0000 mg | Freq: Once | INTRAVENOUS | Status: AC | PRN
Start: 2023-06-15 — End: 2023-06-15
  Administered 2023-06-15: 10 mg via INTRAVENOUS

## 2023-06-15 MED ORDER — SODIUM CHLORIDE 0.9 % IV SOLN: INTRAVENOUS | Status: DC

## 2023-06-15 MED ORDER — SODIUM CHLORIDE 0.9 % IR SOLN
Status: DC | PRN
  Administered 2023-06-15: 1000 mL via INTRAVESICAL

## 2023-06-15 MED ORDER — FENTANYL CITRATE (PF) 100 MCG/2ML IJ SOLN
INTRAMUSCULAR | Status: DC | PRN
  Administered 2023-06-15: 25 ug via INTRAVENOUS
  Administered 2023-06-15: 100 ug via INTRAVENOUS
  Administered 2023-06-15 (×3): 25 ug via INTRAVENOUS

## 2023-06-15 MED ORDER — LIDOCAINE HCL (CARDIAC) PF 100 MG/5ML IV SOSY
PREFILLED_SYRINGE | INTRAVENOUS | Status: DC | PRN
  Administered 2023-06-15: 60 mg via INTRAVENOUS

## 2023-06-15 MED ORDER — CIPROFLOXACIN IN D5W 400 MG/200ML IV SOLN
INTRAVENOUS | Status: AC
  Filled 2023-06-15: qty 200

## 2023-06-15 MED ORDER — DEXAMETHASONE SODIUM PHOSPHATE 10 MG/ML IJ SOLN
INTRAMUSCULAR | Status: AC
  Filled 2023-06-15: qty 1

## 2023-06-15 MED ORDER — LIDOCAINE HCL (PF) 2 % IJ SOLN
INTRAMUSCULAR | Status: AC
  Filled 2023-06-15: qty 5

## 2023-06-15 MED ORDER — ACETAMINOPHEN 500 MG PO TABS
ORAL_TABLET | ORAL | Status: AC
  Filled 2023-06-15: qty 2

## 2023-06-15 MED ORDER — EPHEDRINE SULFATE (PRESSORS) 50 MG/ML IJ SOLN
INTRAMUSCULAR | Status: DC | PRN
  Administered 2023-06-15 (×2): 10 mg via INTRAVENOUS
  Administered 2023-06-15: 5 mg via INTRAVENOUS

## 2023-06-15 MED ORDER — ONDANSETRON HCL 4 MG/2ML IJ SOLN
INTRAMUSCULAR | Status: AC
  Filled 2023-06-15: qty 2

## 2023-06-15 MED ORDER — OXYCODONE HCL 5 MG PO TABS: 5.0000 mg | ORAL_TABLET | Freq: Once | ORAL | Status: DC | PRN

## 2023-06-15 MED ORDER — SUGAMMADEX SODIUM 200 MG/2ML IV SOLN
INTRAVENOUS | Status: DC | PRN
  Administered 2023-06-15: 50 mg via INTRAVENOUS
  Administered 2023-06-15: 200 mg via INTRAVENOUS

## 2023-06-15 MED ORDER — FLEET ENEMA RE ENEM: 1.0000 | ENEMA | Freq: Once | RECTAL | Status: DC

## 2023-06-15 MED ORDER — IOHEXOL 300 MG/ML  SOLN
INTRAMUSCULAR | Status: DC | PRN
  Administered 2023-06-15: 7 mL

## 2023-06-15 SURGICAL SUPPLY — 42 items
BAG URINE DRAIN 2000ML AR STRL (UROLOGICAL SUPPLIES) ×1 IMPLANT
BLADE CLIPPER SENSICLIP SURGIC (BLADE) ×1 IMPLANT
CATH FOLEY 2WAY SLVR 5CC 16FR (CATHETERS) ×1 IMPLANT
CATH ROBINSON RED A/P 16FR (CATHETERS) IMPLANT
CATH ROBINSON RED A/P 20FR (CATHETERS) ×1 IMPLANT
CLOTH BEACON ORANGE TIMEOUT ST (SAFETY) ×1 IMPLANT
CNTNR URN SCR LID CUP LEK RST (MISCELLANEOUS) ×1 IMPLANT
COVER BACK TABLE 60X90IN (DRAPES) ×1 IMPLANT
COVER MAYO STAND STRL (DRAPES) ×1 IMPLANT
DRSG TEGADERM 4X4.75 (GAUZE/BANDAGES/DRESSINGS) ×1 IMPLANT
DRSG TEGADERM 8X12 (GAUZE/BANDAGES/DRESSINGS) ×1 IMPLANT
GAUZE SPONGE 4X4 3PLY NS LF (GAUZE/BANDAGES/DRESSINGS) IMPLANT
GEL ULTRASOUND 20GR AQUASONIC (MISCELLANEOUS) ×2 IMPLANT
GLOVE BIO SURGEON STRL SZ 6.5 (GLOVE) IMPLANT
GLOVE BIO SURGEON STRL SZ7.5 (GLOVE) ×1 IMPLANT
GLOVE BIO SURGEON STRL SZ8 (GLOVE) IMPLANT
GLOVE BIOGEL PI IND STRL 6.5 (GLOVE) IMPLANT
GLOVE SURG ORTHO 8.5 STRL (GLOVE) ×1 IMPLANT
GOWN STRL REUS W/TWL LRG LVL3 (GOWN DISPOSABLE) ×1 IMPLANT
GRID BRACH TEMP 18GA 2.8X3X.75 (MISCELLANEOUS) ×1 IMPLANT
HOLDER FOLEY CATH W/STRAP (MISCELLANEOUS) ×1 IMPLANT
IMPL SPACEOAR VUE SYSTEM (Spacer) ×1 IMPLANT
IMPLANT SPACEOAR VUE SYSTEM (Spacer) ×1 IMPLANT
IV NS 1000ML BAXH (IV SOLUTION) ×1 IMPLANT
KIT TURNOVER CYSTO (KITS) ×1 IMPLANT
NDL BRACHY 18G 5PK (NEEDLE) ×4 IMPLANT
NDL BRACHY 18G SINGLE (NEEDLE) IMPLANT
NDL PK MORGANSTERN STABILIZ (NEEDLE) ×1 IMPLANT
NEEDLE BRACHY 18G 5PK (NEEDLE) ×4
NEEDLE BRACHY 18G SINGLE (NEEDLE)
NEEDLE PK MORGANSTERN STABILIZ (NEEDLE) ×1
PACK CYSTO (CUSTOM PROCEDURE TRAY) ×1 IMPLANT
Quicklink seeds IMPLANT
SHEATH ULTRASOUND LF (SHEATH) IMPLANT
SHEATH ULTRASOUND LTX NONSTRL (SHEATH) IMPLANT
SLEEVE SCD COMPRESS KNEE MED (STOCKING) ×1 IMPLANT
SUT BONE WAX W31G (SUTURE) IMPLANT
SYR 10ML LL (SYRINGE) ×1 IMPLANT
SYR CONTROL 10ML LL (SYRINGE) ×1 IMPLANT
TOWEL OR 17X24 6PK STRL BLUE (TOWEL DISPOSABLE) ×1 IMPLANT
UNDERPAD 30X36 HEAVY ABSORB (UNDERPADS AND DIAPERS) ×2 IMPLANT
WATER STERILE IRR 500ML POUR (IV SOLUTION) ×1 IMPLANT

## 2023-06-15 NOTE — Discharge Instructions (Signed)

## 2023-06-15 NOTE — Op Note (Signed)
Preoperative diagnosis: Prostate cancer Postoperative diagnosis: Same   Procedure: Prostate brachytherapy seed implant, spaceoar gel insertion, Cystoscopy   Surgeon: Mena Goes   Radiation oncologist: Kathrynn Running   Anesthesia: Gen.   Indication for procedure: 73 year old with prostate cancer who elected to proceed with prostate brachytherapy.   Findings: On fluoroscopic imaging there was adequate coverage of the prostate. On cystoscopy the urethra appeared normal, the prostatic urethra appeared normal, the trigone and ureteral orifices appeared normal with clear efflux. The bladder mucosa appeared normal. There was a very small stone (3 mm). No foreign bodies or seeds visualized in the bladder or urethra.   Dose:145 Gy   Description of procedure: After consent was obtained patient brought to the operating room. After adequate anesthesia he is placed in lithotomy position and the transrectal ultrasound probe and perineal template positioned. Catheters and brachytherapy seeds were placed per Dr. Broadus John plan. A total of 17 catheters and 61 active sources (I-125) were placed. The anchoring needles, template and ultrasound were removed. Scout flouro imaging was obtained of the implant. The Foley was removed. Another image obtained.   The 18-gauge needle was then inserted approximately 1 to 2 cm anterior to the anal opening and directed under ultrasonic guidance into the perirectal fat between the anterior rectal wall and the prostate capsule down to the mid-gland. Midline needle position was confirmed in the sagittal and axial views to verify the tip was in the perirectal fat.  Small amounts of saline were injected to hydrodissect the space between the prostate and the anterior rectal wall.  Axial imaging was viewed to confirm the needle was in the correct location in the mid gland and centered.  Aspiration confirmed no intravascular access.  The saline syringe was carefully disconnected maintaining the  desired needle position and the hydrogel was attached to the needle.  Under ultrasound guidance in the sagittal view a smooth continuous injection was done over about 12 seconds delivering the hydrogel into the space between the prostate and rectal wall.  The needle was withdrawn.  The patient was prepped again and cystoscopy was performed which was noted to be normal. He was awakened taken to the recovery room in stable condition.   Complications: None   Blood loss: Minimal   Specimens: None   Drains: None    Disposition: Patient stable to PACU.

## 2023-06-15 NOTE — Progress Notes (Signed)
Radiation Oncology         [phone] ________________________________  Name: Leonard Stark MRN: [id number]  Date: 06/15/2023  DOB: [birth year]       Prostate Seed Implant  OZ:DGUYQIH, [name], MD  No ref. provider found  DIAGNOSIS:  Oncology History  Malignant neoplasm of prostate (HCC)  03/13/2023 Cancer Staging   Staging form: Prostate, AJCC 8th Edition - Clinical stage from 03/13/2023: Stage IIB (cT1c, cN0, cM0, PSA: 7.4, Grade Group: 2) - Signed by Marcello Fennel, PA-C on 04/02/2023 Histopathologic type: Adenocarcinoma, NOS Stage prefix: Initial diagnosis Prostate specific antigen (PSA) range: Less than 10 Gleason primary pattern: 3 Gleason secondary pattern: 4 Gleason score: 7 Histologic grading system: 5 grade system Number of biopsy cores examined: 12 Number of biopsy cores positive: 5 Location of positive needle core biopsies: One side   04/02/2023 Initial Diagnosis   Malignant neoplasm of prostate (HCC)       ICD-10-CM   1. Pre-op testing  Z01.818 CBG per Guidelines for Diabetes Management for Patients Undergoing Surgery ([facility], [facility], and [facility] only)    CBG per protocol    I-Stat, Chem 8 on day of surgery per protocol    CBG per Guidelines for Diabetes Management for Patients Undergoing Surgery ([facility], [facility], and [facility] only)    CBG per protocol    I-Stat, Chem 8 on day of surgery per protocol    2. Malignant neoplasm of prostate (HCC)  C61 PSA    Discharge patient      PROCEDURE: Insertion of radioactive I-125 seeds into the prostate gland.  RADIATION DOSE: 145 Gy, definitive therapy.  TECHNIQUE: JAHARI BILLY was brought to the operating room with the urologist. He was placed in the dorsolithotomy position. He was catheterized and a rectal tube was inserted. The perineum was shaved, prepped and draped. The ultrasound probe was then introduced by me into the rectum to see the prostate gland.  TREATMENT DEVICE: I attached the needle grid to the ultrasound probe stand and  anchor needles were placed.  3D PLANNING: The prostate was imaged in 3D using a sagittal sweep of the prostate probe. These images were transferred to the planning computer. There, the prostate, urethra and rectum were defined on each axial reconstructed image. Then, the software created an optimized 3D plan and a few seed positions were adjusted. The quality of the plan was reviewed using [facility] information for the target and the following two organs at risk:  Urethra and Rectum.  Then the accepted plan was printed and handed off to the radiation therapist.  Under my supervision, the custom loading of the seeds and spacers was carried out using the quick loader.  These pre-loaded needles were then placed into the needle holder.Marland Kitchen  PROSTATE VOLUME STUDY:  Using transrectal ultrasound the volume of the prostate was verified to be 42.4 cc.  SPECIAL TREATMENT PROCEDURE/SUPERVISION AND HANDLING: The pre-loaded needles were then delivered by the urologist under sagittal guidance. A total of 17 needles were used to deposit 61 seeds in the prostate gland. The individual seed activity was 0.487 mCi.  SpaceOAR:  Yes  COMPLEX SIMULATION: At the end of the procedure, an anterior radiograph of the pelvis was obtained to document seed positioning and count. Cystoscopy was performed by the urologist to check the urethra and bladder.  MICRODOSIMETRY: At the end of the procedure, the patient was emitting 0.061 mR/hr at 1 meter. Accordingly, he was considered safe for hospital discharge.  PLAN: The patient will return to  the radiation oncology clinic for post implant CT dosimetry in three weeks.   ________________________________  Artist Pais Kathrynn Running, M.D.

## 2023-06-15 NOTE — Anesthesia Procedure Notes (Signed)
Procedure Name: Intubation Date/Time: 06/15/2023 1:03 PM  Performed by: Jessica Priest, CRNAPre-anesthesia Checklist: Patient identified, Emergency Drugs available, Suction available, Patient being monitored and Timeout performed Patient Re-evaluated:Patient Re-evaluated prior to induction Oxygen Delivery Method: Circle system utilized Preoxygenation: Pre-oxygenation with 100% oxygen Induction Type: IV induction Ventilation: Mask ventilation without difficulty Laryngoscope Size: Mac and 4 Grade View: Grade II Tube type: Oral Tube size: 7.5 mm Number of attempts: 1 Airway Equipment and Method: Stylet and Oral airway Placement Confirmation: ETT inserted through vocal cords under direct vision, positive ETCO2, breath sounds checked- equal and bilateral and CO2 detector Secured at: 23 cm Tube secured with: Tape Dental Injury: Teeth and Oropharynx as per pre-operative assessment

## 2023-06-15 NOTE — Anesthesia Preprocedure Evaluation (Addendum)
Anesthesia Evaluation  Patient identified by MRN, date of birth, ID band Patient awake    Reviewed: Allergy & Precautions, NPO status , Patient's Chart, lab work & pertinent test results  History of Anesthesia Complications Negative for: history of anesthetic complications  Airway Mallampati: II  TM Distance: >3 FB Neck ROM: Full    Dental  (+) Dental Advisory Given   Pulmonary neg pulmonary ROS, former smoker   Pulmonary exam normal breath sounds clear to auscultation       Cardiovascular hypertension (atenolol, valsartan-HCTZ), Pt. on home beta blockers and Pt. on medications (-) angina + CAD  (-) Past MI, (-) Cardiac Stents and (-) CABG + dysrhythmias (sinus arrhythmia)  Rhythm:Regular Rate:Normal  HLD  TTE 09/27/2021: IMPRESSIONS    1. Left ventricular ejection fraction, by estimation, is 65 to 70%. The  left ventricle has normal function. The left ventricle has no regional  wall motion abnormalities. Left ventricular diastolic parameters were  normal.   2. Right ventricular systolic function is normal. The right ventricular  size is normal.   3. The mitral valve is abnormal. Mild mitral valve regurgitation. No  evidence of mitral stenosis.   4. The aortic valve is tricuspid. Aortic valve regurgitation is not  visualized. No aortic stenosis is present.   5. The inferior vena cava is normal in size with greater than 50%  respiratory variability, suggesting right atrial pressure of 3 mmHg.   Stress Test 08/29/2021:   The study is normal. The study is low risk.   No ST deviation was noted.   LV perfusion is normal. There is no evidence of ischemia. There is no evidence of infarction.   Left ventricular function is normal. End diastolic cavity size is normal. End systolic cavity size is normal.     Neuro/Psych  PSYCHIATRIC DISORDERS Anxiety     negative neurological ROS     GI/Hepatic Neg liver ROS, hiatal  hernia,GERD  Medicated,,  Endo/Other  negative endocrine ROS    Renal/GU CRFRenal disease   Prostate cancer    Musculoskeletal   Abdominal   Peds  Hematology negative hematology ROS (+) Lab Results      Component                Value               Date                      WBC                      5.5                 03/06/2023                HGB                      13.6                03/06/2023                HCT                      41.8                03/06/2023                MCV  86.4                03/06/2023                PLT                      170                 03/06/2023              Anesthesia Other Findings H/o benign carcinoid tumor  Reproductive/Obstetrics                              Anesthesia Physical Anesthesia Plan  ASA: 3  Anesthesia Plan: General   Post-op Pain Management: Tylenol PO (pre-op)*   Induction: Intravenous  PONV Risk Score and Plan: 2 and Ondansetron, Dexamethasone and Treatment may vary due to age or medical condition  Airway Management Planned: Oral ETT  Additional Equipment:   Intra-op Plan:   Post-operative Plan: Extubation in OR  Informed Consent: I have reviewed the patients History and Physical, chart, labs and discussed the procedure including the risks, benefits and alternatives for the proposed anesthesia with the patient or authorized representative who has indicated his/her understanding and acceptance.     Dental advisory given  Plan Discussed with: CRNA and Anesthesiologist  Anesthesia Plan Comments: (Risks of general anesthesia discussed including, but not limited to, sore throat, hoarse voice, chipped/damaged teeth, injury to vocal cords, nausea and vomiting, allergic reactions, lung infection, heart attack, stroke, and death. All questions answered. )         Anesthesia Quick Evaluation

## 2023-06-15 NOTE — H&P (Signed)
H&P  Chief Complaint: prostate cancer   History of Present Illness:  Leonard Stark is a 73 y.o. male with a diagnosis of prostate cancer. He was noted to have an elevated PSA of 7.3 by his primary care physician, Dr. Phillips Odor.  He was evaluated in the office. His Aug 2024 rectal examination performed showed no nodules or induration. He was also found to have microscopic hematuria at that visit and a CT A/P showed a 7 mm left ureteral stone at the UVJ. A repeat PSA obtained showed stable elevation at 7.4. The patient proceeded to transrectal ultrasound with 12 biopsies of the prostate on 03/13/23, along with cystoscopy, ureteroscopy, laser lithotripsy and stent placement.  The prostate volume measured 46.87 cc.  Out of 12 core biopsies, 5 were positive, all on the left side.  The maximum Gleason score was 3+4, and this was seen in the base, mid lateral, mid, and apex. Additionally, Gleason 3+3 was seen in the left apex lateral.   He presents today for brachytherapy and SpaceOar instillation.  He has been well without dysuria or gross hematuria.  No cough, cold or congestion.  Past Medical History:  Diagnosis Date   BPH associated with nocturia    CAD (coronary artery disease) 2011   cardiology--- dr s. 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 Garen Lah;   stress test suggestive ischemia;   mild non-obstructive CAD w/ normal LVEF;   dLM 10%/  mLAD 30%/  ostial D1 30%/  ostial OM1 20%   COLONOSCOPY  10/13/2011   benign polyps, poor prep compromised exam, next TCS 7 years.    COLONOSCOPY N/A 11/09/2017   Dr. Jena Gauss: Tortuous colon, melanosis coli.  Repeat colonoscopy in 10 years.   CYSTOSCOPY/URETEROSCOPY/HOLMIUM LASER/STENT PLACEMENT Left 03/13/2023   Procedure: CYSTOSCOPY LEFT RETROGRADE PYELOGRAM LEFT URETEROSCOPY/HOLMIUM LASER/STENT PLACEMENT;  Surgeon: Jerilee Field, MD;  Location: [facility];  Service: Urology;  Laterality: Left;   ESOPHAGOGASTRODUODENOSCOPY N/A 11/09/2017   Dr. Jena Gauss: Small hiatal hernia, normal esophagus status post dilation for dysphagia.   LAPAROSCOPIC APPENDECTOMY  10/15/2013   @ [facility] in [place] by dr b. Cristy Folks;  perferated appendix and messenteric reseciton;  per path --Carcinoid tumor, low-grade, 0.4 cm in maximal dimension coming to 0.3 cm of the closest mesenteric resection margin   MALONEY DILATION N/A 11/09/2017   Procedure: Elease Hashimoto DILATION;  Surgeon: Corbin Ade, MD;  [facility];  Service: Endoscopy;  Laterality: N/A;   PROSTATE BIOPSY N/A 03/13/2023  Procedure: BIOPSY TRANSRECTAL ULTRASONIC PROSTATE (TUBP);  Surgeon: Jerilee Field, MD;  [facility];  Service: Urology;  Laterality: N/A;  90 MINS FOR CASE    Home Medications:  No medications prior to admission.   Allergies: No Known Allergies  Family History  Problem Relation Age of Onset   Stroke Mother    Hypertension Sister    Diabetes Sister    Colon cancer Neg Hx    Social History:  reports that he has quit smoking. His smoking use included cigarettes. He  has never used smokeless tobacco. He reports that he does not drink alcohol and does not use drugs.  ROS: A complete review of systems was performed.  All systems are negative except for pertinent findings as noted. ROS   Physical Exam:  Vital signs in last 24 hours: Weight:  [85.7 kg] 85.7 kg (01/23 1413) General:  Alert and oriented, No acute distress HEENT: Normocephalic, atraumatic Cardiovascular: Regular rate and rhythm Lungs: Regular rate and effort Abdomen: Soft, nontender, nondistended, no abdominal masses Back: No CVA tenderness Extremities: No edema Neurologic: Grossly intact  Laboratory Data:  No results found for this or any previous visit (from the past 24 hours). No results found for this or any previous visit (from the past 240 hours). Creatinine: No results for input(s): "CREATININE" in the last 168 hours.  Impression/Assessment:  Prostate cancer-  Plan:  I discussed with the patient the nature, potential benefits, risks and alternatives to prostate permanent brachytherapy, SpaceOAR biodegradable gel insertion, and cystoscopy, including side effects of the proposed treatment, the likelihood of the patient achieving the goals of the procedure, and any potential problems that might occur during the procedure or recuperation.  We discussed risk of bleeding, infection, stricture, injury to bowel or bladder among others.  All questions answered. Patient elects to proceed.    Jerilee Field 06/15/2023

## 2023-06-15 NOTE — Transfer of Care (Signed)
Immediate Anesthesia Transfer of Care Note  Patient: Leonard Stark  Procedure(s) Performed: Procedure(s) (LRB): RADIOACTIVE SEED IMPLANT/BRACHYTHERAPY IMPLANT (N/A) SPACE OAR INSTILLATION (N/A)  Patient Location: PACU  Anesthesia Type: GA  Level of Consciousness: awake, sedated, patient cooperative and responds to stimulation, c/o pain in back - comfort measures given w/ medication   Airway & Oxygen Therapy: Patient Spontanous Breathing and Patient connected to [place] oxygen  Post-op Assessment: Report given to PACU RN, Post -op Vital signs reviewed and stable and Patient moving all extremities  Post vital signs: Reviewed and stable  Complications: No apparent anesthesia complications

## 2023-06-16 NOTE — Anesthesia Postprocedure Evaluation (Signed)
Anesthesia Post Note  Patient: Leonard Stark  Procedure(s) Performed: RADIOACTIVE SEED IMPLANT/BRACHYTHERAPY IMPLANT SPACE OAR INSTILLATION     Patient location during evaluation: PACU Anesthesia Type: General Level of consciousness: awake Pain management: pain level controlled Vital Signs Assessment: post-procedure vital signs reviewed and stable Respiratory status: spontaneous breathing, nonlabored ventilation and respiratory function stable Cardiovascular status: blood pressure returned to baseline and stable Postop Assessment: no apparent nausea or vomiting Anesthetic complications: no   No notable events documented.  Last Vitals:  Vitals:   06/15/23 1523 06/15/23 1600  BP: 119/84 135/89  Pulse: 63 63  Resp: 15 16  Temp: (!) 36.3 C   SpO2: 100% 96%    Last Pain:  Vitals:   06/15/23 1600  TempSrc:   PainSc: 0-No pain                 Linton Rump

## 2023-06-18 ENCOUNTER — Encounter (HOSPITAL_BASED_OUTPATIENT_CLINIC_OR_DEPARTMENT_OTHER): Payer: Self-pay | Admitting: Urology

## 2023-06-25 ENCOUNTER — Ambulatory Visit: Payer: Medicare Other | Admitting: Urology

## 2023-06-25 VITALS — BP 126/84 | HR 73

## 2023-06-25 DIAGNOSIS — Z87442 Personal history of urinary calculi: Secondary | ICD-10-CM

## 2023-06-25 DIAGNOSIS — C61 Malignant neoplasm of prostate: Secondary | ICD-10-CM | POA: Diagnosis not present

## 2023-06-25 DIAGNOSIS — N200 Calculus of kidney: Secondary | ICD-10-CM

## 2023-06-25 DIAGNOSIS — R3129 Other microscopic hematuria: Secondary | ICD-10-CM | POA: Diagnosis not present

## 2023-06-25 MED ORDER — HYDROCODONE-ACETAMINOPHEN 7.5-325 MG PO TABS: 1.0000 | ORAL_TABLET | Freq: Four times a day (QID) | ORAL | 0 refills | Status: DC | PRN

## 2023-06-25 NOTE — Progress Notes (Signed)
Patient was a RadOnc Consult on 11/12 for his stage T1c adenocarcinoma of the prostate with Gleason score of 3+4, and PSA of 7.4.  Patient proceed with treatment recommendations of brachytherapy and had his treatment on 06/15/23.   Patient is scheduled for a post seed CT Simulation on 2/13 and will follow up with urology on 06/25/23.

## 2023-06-25 NOTE — Progress Notes (Unsigned)
06/25/2023 3:17 PM   Leonard Stark 07/20/1950 [id number]  Referring provider: Assunta Found, MD [address] [place],  [place] [address]  No chief complaint on file.   HPI: F/u -   1) prostate cancer -- He was noted to have an elevated PSA of 7.3 by his primary care physician, Dr. Phillips Odor.  He was evaluated in the office. His Aug 2024 rectal examination performed showed no nodules or induration. He was also found to have microscopic hematuria at that visit and a CT A/P showed a 7 mm left ureteral stone at the UVJ. A repeat PSA obtained showed stable elevation at 7.4. The patient proceeded to transrectal ultrasound with 12 biopsies of the prostate on 03/13/23, along with cystoscopy, ureteroscopy, laser lithotripsy and stent placement.  The prostate volume measured 46.87 cc.  Out of 12 core biopsies, 5 were positive, all on the left side.  The maximum Gleason score was 3+4, and this was seen in the base, mid lateral, mid, and apex. Additionally, Gleason 3+3 was seen in the left apex lateral.    He was treated with brachytherapy and SpaceOar instillation 06/15/2023. Patient is scheduled for a post seed CT Simulation on 07/05/2023.   2) urolithiasis - a Sep 2024 CT showed bilateral punctate kidneys and large left distal stones. S/p left ureteroscopy, laser lithotripsy and stent for 2 left distal stones and a left ureterocele October 2024.  He remove the stent with the string.   Today, seen for the above. Having right flank pain. Started a few days ago. He took a oxycodone which helped - 7.5/325. Nothing seem to make it worse. UA with 3-10 rbc. Voiding well. No gross hematuria. No dysuria or gross hematuria. He has some hesitancy. He takes tamsulosin. He is staying hydrated.    PMH: Past Medical History:  Diagnosis Date   BPH associated with nocturia    CAD (coronary artery disease) 2011   cardiology--- dr s. 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 Garen Lah;   stress test suggestive ischemia;   mild non-obstructive CAD w/ normal LVEF;   dLM 10%/  mLAD 30%/  ostial D1 30%/  ostial OM1 20%   COLONOSCOPY  10/13/2011   benign polyps, poor prep compromised exam, next TCS 7 years.    COLONOSCOPY N/A 11/09/2017   Dr. Jena Gauss: Tortuous colon, melanosis coli.  Repeat colonoscopy in 10 years.   CYSTOSCOPY/URETEROSCOPY/HOLMIUM LASER/STENT PLACEMENT Left 03/13/2023   Procedure: CYSTOSCOPY LEFT RETROGRADE PYELOGRAM LEFT URETEROSCOPY/HOLMIUM  LASER/STENT PLACEMENT;  Surgeon: Jerilee Field, MD;  [facility];  Service: Urology;  Laterality: Left;   ESOPHAGOGASTRODUODENOSCOPY N/A 11/09/2017   Dr. Jena Gauss: Small hiatal hernia, normal esophagus status post dilation for dysphagia.   LAPAROSCOPIC APPENDECTOMY  10/15/2013   @ [facility] in [place] by dr b. Cristy Folks;  perferated appendix and messenteric reseciton;  per path --Carcinoid tumor, low-grade, 0.4 cm in maximal dimension coming to 0.3 cm of the closest mesenteric resection margin   MALONEY DILATION N/A  11/09/2017   Procedure: Elease Hashimoto DILATION;  Surgeon: Corbin Ade, MD;  [facility];  Service: Endoscopy;  Laterality: N/A;   PROSTATE BIOPSY N/A 03/13/2023   Procedure: BIOPSY TRANSRECTAL ULTRASONIC PROSTATE (TUBP);  Surgeon: Jerilee Field, MD;  [facility];  Service: Urology;  Laterality: N/A;  90 MINS FOR CASE   RADIOACTIVE SEED IMPLANT N/A 06/15/2023   Procedure: RADIOACTIVE SEED IMPLANT/BRACHYTHERAPY IMPLANT;  Surgeon: Jerilee Field, MD;  Location: [facility];  Service: Urology;  Laterality: N/A;   SPACE OAR INSTILLATION N/A 06/15/2023   Procedure: SPACE OAR INSTILLATION;  Surgeon: Jerilee Field, MD;  Location: [facility];  Service: Urology;  Laterality: N/A;    Home Medications:  Allergies as of 06/25/2023   No Known Allergies      Medication List        Accurate as of June 25, 2023  3:17 PM. If you have any questions, ask your nurse or doctor.          acetaminophen 500 MG tablet Commonly known as: TYLENOL Take 1,000 mg by mouth every 6 (six) hours as needed for moderate pain.   aspirin EC 81 MG tablet Take 81 mg by mouth daily. Swallow whole.   atenolol 50 MG tablet Commonly known as: TENORMIN Take 50 mg by mouth every morning.   cephALEXin 500 MG capsule Commonly known as: KEFLEX Take 1 capsule (500 mg total) by mouth 2 (two) times daily.   clonazePAM 0.5 MG tablet Commonly known as: KLONOPIN Take 0.5 mg by mouth 2 (two) times daily as needed for anxiety.   CoQ10 100 MG Caps Take 100 mg by mouth daily.   Fish Oil 1000 MG Caps Take 1,000 mg by mouth daily.   HYDROcodone-acetaminophen 7.5-325 MG tablet Commonly known as: Norco Take 1 tablet by mouth every 6 (six) hours as needed for moderate pain (pain score 4-6).   Linzess 290 MCG Caps capsule Generic drug: linaclotide Take 290 mcg by mouth daily as needed.   ondansetron 4 MG disintegrating tablet Commonly known as: ZOFRAN-ODT Take 4 mg  by mouth every 8 (eight) hours as needed.   oxybutynin 15 MG 24 hr tablet Commonly known as: DITROPAN XL Take 15 mg by mouth daily.   pantoprazole 40 MG tablet Commonly known as: PROTONIX TAKE 1 TABLET TWICE A DAY BEFORE MEALS What changed: See the new instructions.   pravastatin 40 MG tablet Commonly known as: PRAVACHOL Take 40 mg by mouth every morning.   tamsulosin 0.4 MG Caps capsule Commonly known as: FLOMAX Take 1 capsule (0.4 mg total) by mouth daily after supper.   valsartan-hydrochlorothiazide 320-25 MG tablet Commonly known as: DIOVAN-HCT Take 1 tablet by mouth daily.        Allergies: No Known Allergies  Family History: Family History  Problem Relation Age of Onset   Stroke Mother    Hypertension Sister    Diabetes Sister    Colon cancer Neg Hx     Social History:  reports that he has quit smoking. His smoking use included cigarettes. He has never used smokeless tobacco. He reports that he does not drink alcohol and does not use drugs.   Physical Exam: BP 126/84   Pulse 73   Constitutional:  Alert and oriented, No acute distress. HEENT: [place] AT, moist mucus membranes.  Trachea midline, no masses. Cardiovascular: No clubbing, cyanosis, or edema. Respiratory: Normal respiratory effort, no increased work of breathing. GI: Abdomen is soft, nontender, nondistended, no abdominal masses GU: No CVA tenderness Skin: No rashes, bruises or suspicious lesions. Neurologic: Grossly intact, no focal deficits, moving all 4 extremities. Psychiatric: Normal mood and affect.  Laboratory Data: Lab Results  Component Value Date   WBC 5.5 03/06/2023   HGB 14.6 06/15/2023   HCT 43.0 06/15/2023   MCV 86.4 03/06/2023   PLT 170 03/06/2023    Lab Results  Component Value Date   CREATININE 1.30 (H) 06/15/2023    No results found for: "PSA"  No results found for: "TESTOSTERONE"  No results found for: "HGBA1C"  Urinalysis    Component Value Date/Time    COLORURINE YELLOW 12/30/2012 1825   APPEARANCEUR Clear 03/26/2023 1351   LABSPEC >1.030 (H) 12/30/2012 1825   PHURINE 6.0 12/30/2012 1825   GLUCOSEU Negative 03/26/2023 1351   HGBUR MODERATE (A) 12/30/2012 1825   BILIRUBINUR Negative 03/26/2023 1351   KETONESUR NEGATIVE 12/30/2012 1825   PROTEINUR Negative 03/26/2023 1351   PROTEINUR NEGATIVE 12/30/2012 1825   UROBILINOGEN 1.0 12/30/2012 1825   NITRITE Negative 03/26/2023 1351   NITRITE NEGATIVE 12/30/2012 1825   LEUKOCYTESUR Negative 03/26/2023 1351    Lab Results  Component Value Date   LABMICR See below: 03/26/2023   WBCUA 0-5 03/26/2023   LABEPIT 0-10 03/26/2023   BACTERIA None seen 03/26/2023    Pertinent Imaging: N/a Assessment & Plan:    1. Prostate cancer (HCC) (Primary) S/p brachytherapy. CT sim coming up.  - Urinalysis, Routine w reflex microscopic  2. Kidney stones, flank pain - assess with CT   3. microhematuria - may be from brachytherapy but assess as above.   No follow-ups on file.  Jerilee Field, MD  [facility]  [address] [place], [place] [address] [phone]

## 2023-06-26 LAB — URINALYSIS, ROUTINE W REFLEX MICROSCOPIC
Bilirubin, UA: NEGATIVE
Glucose, UA: NEGATIVE
Ketones, UA: NEGATIVE
Leukocytes,UA: NEGATIVE
Nitrite, UA: NEGATIVE
Protein,UA: NEGATIVE
Specific Gravity, UA: 1.02 (ref 1.005–1.030)
Urobilinogen, Ur: 1 mg/dL (ref 0.2–1.0)
pH, UA: 6 (ref 5.0–7.5)

## 2023-06-26 LAB — MICROSCOPIC EXAMINATION: Bacteria, UA: NONE SEEN

## 2023-07-03 NOTE — Progress Notes (Signed)
Post-seed nursing interview for a diagnosis of Malignant neoplasm of prostate (HCC).  Patient identity verified x2.   Patient reports groin pressure/ dysuria 5/10, polyuria, urgency, nocturia x2 and severe ED that predates current diagnosis. Patient denies all other related issues at this time.  Meaningful use complete.  I-PSS score- 21 - Severe SHIM score- 1- No sexual activity within the last 6 months. Urinary Management medication(s) Tamsulosin Urology appointment date- 09/24/23 with Dr. Mena Goes at [facility] [place]  Vitals- BP (!) 145/97 (BP Location: Right Arm)   Pulse 60   Temp (!) 96.9 F (36.1 C) (Temporal)   Resp 18   Ht 6' (1.829 m)   Wt 197 lb (89.4 kg)   SpO2 100%   BMI 26.72 kg/m   This concludes the interaction.  Ruel Favors, LPN

## 2023-07-04 ENCOUNTER — Telehealth: Payer: Self-pay | Admitting: *Deleted

## 2023-07-04 NOTE — Progress Notes (Signed)
Radiation Oncology         [phone] ________________________________  Name: Leonard Stark MRN: [id number]  Date: 07/05/2023  DOB: Jul 27, 1950  Post-Seed Follow-Up Visit Note  CC: Assunta Found, MD  Jerilee Field, MD  Diagnosis:   73 y.o. gentleman with Stage T1c adenocarcinoma of the prostate with Gleason score of 3+4, and PSA of 7.4.     ICD-10-CM   1. Malignant neoplasm of prostate (HCC)  C61       Interval Since Last Radiation:  3 weeks 06/15/23:  Insertion of radioactive I-125 seeds into the prostate gland; 145 Gy, definitive therapy with placement of SpaceOAR gel.  Narrative:  The patient returns today for routine follow-up.  He is complaining of increased urinary frequency and urinary hesitation symptoms. He filled out a questionnaire regarding urinary function today providing and overall IPSS score of 21 characterizing his symptoms as severe but tolerable and improving.  His pre-implant score was 15, on Flomax. He denies any abdominal pain or bowel symptoms aside from increased frequency and smaller volume soft stools. He denies dysuria, gross hematuria, flank pain, fever or chills. His energy level has remained relatively unchanged and he is anxious to get back to work. Overall, he is quite pleased with his progress to date.  ALLERGIES:  has no known allergies.  Meds: Current Outpatient Medications  Medication Sig Dispense Refill   acetaminophen (TYLENOL) 500 MG tablet Take 1,000 mg by mouth every 6 (six) hours as needed for moderate pain.     aspirin EC 81 MG tablet Take 81 mg by mouth daily. Swallow whole.     atenolol (TENORMIN) 50 MG tablet Take 50 mg by mouth every morning.     cephALEXin (KEFLEX) 500 MG capsule Take 1 capsule (500 mg total) by mouth 2 (two) times daily. 10 capsule 0   clonazePAM (KLONOPIN) 0.5 MG tablet Take 0.5 mg by mouth 2 (two) times daily as needed for anxiety.     Coenzyme Q10 (COQ10) 100 MG CAPS Take 100 mg by mouth daily.      HYDROcodone-acetaminophen (NORCO) 7.5-325 MG tablet Take 1 tablet by mouth every 6 (six) hours as needed for moderate pain (pain score 4-6). 10 tablet 0   linaclotide (LINZESS) 290 MCG CAPS capsule Take 290 mcg by mouth daily as needed.     Omega-3 Fatty Acids (FISH OIL) 1000 MG CAPS Take 1,000 mg by mouth daily.     ondansetron (ZOFRAN-ODT) 4 MG disintegrating tablet Take 4 mg by mouth every 8 (eight) hours as needed.     oxybutynin (DITROPAN XL) 15 MG 24 hr tablet Take 15 mg by mouth daily.     pantoprazole (PROTONIX) 40 MG tablet TAKE 1 TABLET TWICE A DAY BEFORE MEALS (Patient taking differently: Take 40 mg by mouth 2 (two) times daily before a meal.) 180 tablet 3   pravastatin (PRAVACHOL) 40 MG tablet Take 40 mg by mouth every morning.     tamsulosin (FLOMAX) 0.4 MG CAPS capsule Take 1 capsule (0.4 mg total) by mouth daily after supper. 90 capsule 3   valsartan-hydrochlorothiazide (DIOVAN-HCT) 320-25 MG tablet Take 1 tablet by mouth daily.     No current facility-administered medications for this encounter.   Facility-Administered Medications Ordered in Other Encounters  Medication Dose Route Frequency Provider Last Rate Last Admin   sodium phosphate (FLEET) enema 1 enema  1 enema Rectal Once Jerilee Field, MD        Physical Findings: In general this is a well appearing  African American male in no acute distress. He's alert and oriented x4 and appropriate throughout the examination. Cardiopulmonary assessment is negative for acute distress and he exhibits normal effort.   Lab Findings: Lab Results  Component Value Date   WBC 5.5 03/06/2023   HGB 14.6 06/15/2023   HCT 43.0 06/15/2023   MCV 86.4 03/06/2023   PLT 170 03/06/2023    Radiographic Findings:  Patient underwent CT imaging in our clinic for post implant dosimetry. The CT will be reviewed by Dr. Kathrynn Running to confirm there is an adequate distribution of radioactive seeds throughout the prostate gland and ensure that there  are no seeds in or near the rectum.  We suspect the final radiation plan and dosimetry will show appropriate coverage of the prostate gland. He understands that we will call and inform him of any unexpected findings on further review of his imaging and dosimetry.  Impression/Plan: 73 y.o. gentleman with Stage T1c adenocarcinoma of the prostate with Gleason score of 3+4, and PSA of 7.4.  The patient is recovering from the effects of radiation. His urinary symptoms should gradually improve over the next 4-6 months. We talked about this today. He is encouraged by his improvement already and is otherwise pleased with his outcome. We also talked about long-term follow-up for prostate cancer following seed implant. He understands that ongoing PSA determinations and digital rectal exams will help perform surveillance to rule out disease recurrence. He is scheduled for labs on 09/17/23 and a follow up appointment with Dr. Mena Goes on 09/24/23. He understands what to expect with his PSA measures. Patient was also educated today about some of the long-term effects from radiation including a small risk for rectal bleeding and possibly erectile dysfunction. We talked about some of the general management approaches to these potential complications. However, I did encourage the patient to contact our office or return at any point if he has questions or concerns related to his previous radiation and prostate cancer.    Marguarite Arbour, PA-C

## 2023-07-04 NOTE — Telephone Encounter (Signed)
CALLED PATIENT TO REMIND OF POST SEED APPTS. FOR 07-05-23, SPOKE WITH PATIENT AND HE IS AWARE OF THESE APPTS.

## 2023-07-04 NOTE — Progress Notes (Signed)
  Radiation Oncology         [phone] ________________________________  Name: RAEL YO MRN: [id number]  Date: 07/05/2023  DOB: [birth year]  COMPLEX SIMULATION NOTE  NARRATIVE:  The patient was brought to the CT Simulation planning suite today following prostate seed implantation approximately one month ago.  Identity was confirmed.  All relevant records and images related to the planned course of therapy were reviewed.  Then, the patient was set-up supine.  CT images were obtained.  The CT images were loaded into the planning software.  Then the prostate and rectum were contoured.  Treatment planning then occurred.  The implanted iodine 125 seeds were identified by the physics staff for projection of radiation distribution  I have requested : 3D Simulation  I have requested a DVH of the following structures: Prostate and rectum.    ________________________________  Artist Pais Kathrynn Running, M.D.

## 2023-07-05 ENCOUNTER — Ambulatory Visit
Admission: RE | Admit: 2023-07-05 | Discharge: 2023-07-05 | Disposition: A | Discharge status: Home / Self Care | Payer: Medicare Other | Source: Ambulatory Visit | Attending: Radiation Oncology | Admitting: Radiation Oncology

## 2023-07-05 ENCOUNTER — Ambulatory Visit
Admission: RE | Admit: 2023-07-05 | Discharge: 2023-07-05 | Disposition: A | Discharge status: Home / Self Care | Payer: Medicare Other | Source: Ambulatory Visit | Attending: Urology | Admitting: Urology

## 2023-07-05 ENCOUNTER — Encounter: Payer: Self-pay | Admitting: *Deleted

## 2023-07-05 ENCOUNTER — Encounter: Payer: Self-pay | Admitting: Urology

## 2023-07-05 VITALS — BP 145/97 | HR 60 | Temp 96.9°F | Resp 18 | Ht 72.0 in | Wt 197.0 lb

## 2023-07-05 DIAGNOSIS — Z79899 Other long term (current) drug therapy: Secondary | ICD-10-CM | POA: Insufficient documentation

## 2023-07-05 DIAGNOSIS — Z7982 Long term (current) use of aspirin: Secondary | ICD-10-CM | POA: Insufficient documentation

## 2023-07-05 DIAGNOSIS — C61 Malignant neoplasm of prostate: Secondary | ICD-10-CM | POA: Insufficient documentation

## 2023-07-05 DIAGNOSIS — Z923 Personal history of irradiation: Secondary | ICD-10-CM | POA: Insufficient documentation

## 2023-07-10 ENCOUNTER — Telehealth: Payer: Self-pay | Admitting: Urology

## 2023-07-10 ENCOUNTER — Encounter: Payer: Self-pay | Admitting: Radiation Oncology

## 2023-07-10 DIAGNOSIS — Z923 Personal history of irradiation: Secondary | ICD-10-CM | POA: Diagnosis not present

## 2023-07-10 DIAGNOSIS — Z191 Hormone sensitive malignancy status: Secondary | ICD-10-CM | POA: Diagnosis not present

## 2023-07-10 DIAGNOSIS — Z7982 Long term (current) use of aspirin: Secondary | ICD-10-CM | POA: Diagnosis not present

## 2023-07-10 DIAGNOSIS — C61 Malignant neoplasm of prostate: Secondary | ICD-10-CM | POA: Diagnosis not present

## 2023-07-10 DIAGNOSIS — Z79899 Other long term (current) drug therapy: Secondary | ICD-10-CM | POA: Diagnosis not present

## 2023-07-10 NOTE — Telephone Encounter (Signed)
Patient significant other called she said he is having a lot of pain while urinating , she would like dr eskridge to call in pain medication , he is having no other symptoms.

## 2023-07-10 NOTE — Radiation Completion Notes (Signed)
Patient Name: Leonard Stark, Leonard Stark MRN: [id number] Date of Birth: 01-02-1951 Referring Physician: Jerilee Field, M.D. Date of Service: 2023-07-10 Radiation Oncologist: Margaretmary Bayley, M.D. [facility] -                              RADIATION ONCOLOGY END OF TREATMENT NOTE     Diagnosis: C61 Malignant neoplasm of prostate Staging on 2023-03-13: Malignant neoplasm of prostate (HCC) T=cT1c, N=cN0, M=cM0 Intent: Curative     ==========DELIVERED PLANS==========  Prostate Seed Implant Date: 2023-06-15   Plan Name: Prostate Seed Implant Site: Prostate Technique: Radioactive Seed Implant I-125 Mode: Brachytherapy Dose Per Fraction: 145 Gy Prescribed Dose (Delivered / Prescribed): 145 Gy / 145 Gy Prescribed Fxs (Delivered / Prescribed): 1 / 1     ==========ON TREATMENT VISIT DATES========== 2023-06-15     ==========UPCOMING VISITS==========

## 2023-07-10 NOTE — Telephone Encounter (Signed)
Patient is made aware due to office closing and possible bad weather. Patient is advised to go to the ED for dysuria. Patient voiced understanding.

## 2023-07-10 NOTE — Progress Notes (Signed)
  Radiation Oncology         [phone] ________________________________  Name: Leonard Stark MRN: [id number]  Date: 07/10/2023  DOB: February 03, 1951  3D Planning Note   Prostate Brachytherapy Post-Implant Dosimetry  Diagnosis: 73 y.o. gentleman with Stage T1c adenocarcinoma of the prostate with Gleason score of 3+4, and PSA of 7.4.   Narrative: On a previous date, Leonard Stark returned following prostate seed implantation for post implant planning. He underwent CT scan complex simulation to delineate the three-dimensional structures of the pelvis and demonstrate the radiation distribution.  Since that time, the seed localization, and complex isodose planning with dose volume histograms have now been completed.  Results:   Prostate Coverage - The dose of radiation delivered to the 90% or more of the prostate gland (D90) was 118.38% of the prescription dose. This exceeds our goal of greater than 90%. Rectal Sparing - The volume of rectal tissue receiving the prescription dose or higher was 0.0 cc. This falls under our thresholds tolerance of 1.0 cc.  Impression: The prostate seed implant appears to show adequate target coverage and appropriate rectal sparing.  Plan:  The patient will continue to follow with urology for ongoing PSA determinations. I would anticipate a high likelihood for local tumor control with minimal risk for rectal morbidity.  ________________________________  Artist Pais Kathrynn Running, M.D.

## 2023-07-12 ENCOUNTER — Emergency Department (HOSPITAL_COMMUNITY)
Admission: EM | Admit: 2023-07-12 | Discharge: 2023-07-12 | Disposition: A | Discharge status: Home / Self Care | Payer: Medicare Other | Attending: Emergency Medicine | Admitting: Emergency Medicine

## 2023-07-12 ENCOUNTER — Encounter (HOSPITAL_COMMUNITY): Payer: Self-pay | Admitting: Emergency Medicine

## 2023-07-12 ENCOUNTER — Other Ambulatory Visit: Payer: Self-pay

## 2023-07-12 ENCOUNTER — Emergency Department (HOSPITAL_COMMUNITY): Payer: Medicare Other

## 2023-07-12 DIAGNOSIS — N3000 Acute cystitis without hematuria: Secondary | ICD-10-CM | POA: Diagnosis not present

## 2023-07-12 DIAGNOSIS — Z7982 Long term (current) use of aspirin: Secondary | ICD-10-CM | POA: Diagnosis not present

## 2023-07-12 DIAGNOSIS — R1032 Left lower quadrant pain: Secondary | ICD-10-CM | POA: Diagnosis not present

## 2023-07-12 DIAGNOSIS — R1030 Lower abdominal pain, unspecified: Secondary | ICD-10-CM | POA: Diagnosis present

## 2023-07-12 DIAGNOSIS — Z8546 Personal history of malignant neoplasm of prostate: Secondary | ICD-10-CM | POA: Diagnosis not present

## 2023-07-12 DIAGNOSIS — R1031 Right lower quadrant pain: Secondary | ICD-10-CM | POA: Diagnosis not present

## 2023-07-12 LAB — URINALYSIS, ROUTINE W REFLEX MICROSCOPIC
Bilirubin Urine: NEGATIVE
Glucose, UA: NEGATIVE mg/dL
Ketones, ur: NEGATIVE mg/dL
Leukocytes,Ua: NEGATIVE
Nitrite: NEGATIVE
Protein, ur: NEGATIVE mg/dL
Specific Gravity, Urine: 1.014 (ref 1.005–1.030)
pH: 6 (ref 5.0–8.0)

## 2023-07-12 MED ORDER — OXYCODONE-ACETAMINOPHEN 5-325 MG PO TABS
2.0000 | ORAL_TABLET | Freq: Once | ORAL | Status: AC
  Administered 2023-07-12: 2 via ORAL
  Filled 2023-07-12: qty 2

## 2023-07-12 MED ORDER — HYDROMORPHONE HCL 1 MG/ML IJ SOLN
1.0000 mg | Freq: Once | INTRAMUSCULAR | Status: AC
  Administered 2023-07-12: 1 mg via INTRAMUSCULAR
  Filled 2023-07-12: qty 1

## 2023-07-12 MED ORDER — CEPHALEXIN 500 MG PO CAPS: 500.0000 mg | ORAL_CAPSULE | Freq: Four times a day (QID) | ORAL | 0 refills | Status: DC

## 2023-07-12 MED ORDER — CEFTRIAXONE SODIUM 1 G IJ SOLR
1.0000 g | Freq: Once | INTRAMUSCULAR | Status: AC
  Administered 2023-07-12: 1 g via INTRAMUSCULAR
  Filled 2023-07-12: qty 10

## 2023-07-12 MED ORDER — OXYCODONE-ACETAMINOPHEN 5-325 MG PO TABS: 1.0000 | ORAL_TABLET | Freq: Four times a day (QID) | ORAL | 0 refills | Status: DC | PRN

## 2023-07-12 NOTE — Discharge Instructions (Signed)
Follow-up with either your family doctor or your urologist next week

## 2023-07-12 NOTE — ED Triage Notes (Signed)
Pt reported he recently had radiation treatment for prostate cancer. Pt reported he was told he may have some complications from that treatment. He reported to the ED for pain in bilateral calfs at a 10/10. Pt also reports severe groin pain at a 9/10 during urination, with difficulty urinating. This is also accompanied with bowl incontinence. Pt is having difficulty walking.

## 2023-07-12 NOTE — ED Provider Notes (Signed)
[facility] AT [facility] Provider Note   CSN: [id number] Arrival date & time: 07/12/23  1435     History {Add pertinent medical, surgical, social history, OB history to HPI:1} Chief Complaint  Patient presents with   Leg Pain   Groin Pain    Leonard Stark is a 73 y.o. male.  Patient has prostate cancer.  Patient recently had radiation therapy and now complains of pain suprapubic and dysuria   Leg Pain Groin Pain       Home Medications Prior to Admission medications   Medication Sig Start Date End Date Taking? Authorizing Provider  acetaminophen (TYLENOL) 500 MG tablet Take 1,000 mg by mouth every 6 (six) hours as needed for moderate pain.   Yes [provider]  aspirin EC 81 MG tablet Take 81 mg by mouth daily. Swallow whole.   Yes [provider]  atenolol (TENORMIN) 50 MG tablet Take 50 mg by mouth every morning.   Yes [provider]  cephALEXin (KEFLEX) 500 MG capsule Take 1 capsule (500 mg total) by mouth 4 (four) times daily. 07/12/23  Yes Bethann Berkshire, MD  clonazePAM (KLONOPIN) 0.5 MG tablet Take 0.5 mg by mouth 2 (two) times daily. 11/15/22  Yes [provider]  Coenzyme Q10 (COQ10) 100 MG CAPS Take 100 mg by mouth daily.   Yes [provider]  HYDROcodone-acetaminophen (NORCO) 7.5-325 MG tablet Take 1 tablet by mouth every 6 (six) hours as needed for moderate pain (pain score 4-6). 06/25/23  Yes Jerilee Field, MD  linaclotide Karlene Einstein) 290 MCG CAPS capsule Take 290 mcg by mouth daily before breakfast.   Yes [provider]  meloxicam (MOBIC) 15 MG tablet Take 15 mg by mouth daily. 05/07/23  Yes [provider]  Omega-3 Fatty Acids (FISH OIL) 1000 MG CAPS Take 1,000 mg by mouth daily.   Yes [provider]  oxybutynin (DITROPAN XL) 15 MG 24 hr tablet Take 15 mg by mouth daily. 11/09/22  Yes [provider]  oxyCODONE-acetaminophen (PERCOCET/ROXICET) 5-325  MG tablet Take 1 tablet by mouth every 6 (six) hours as needed for severe pain (pain score 7-10). 07/12/23  Yes Bethann Berkshire, MD  pantoprazole (PROTONIX) 40 MG tablet TAKE 1 TABLET TWICE A DAY BEFORE MEALS Patient taking differently: Take 40 mg by mouth 2 (two) times daily before a meal. 04/17/18  Yes Anice Paganini, NP  pravastatin (PRAVACHOL) 40 MG tablet Take 40 mg by mouth every morning.   Yes [provider]  tamsulosin (FLOMAX) 0.4 MG CAPS capsule Take 1 capsule (0.4 mg total) by mouth daily after supper. 03/26/23  Yes Jerilee Field, MD  valsartan-hydrochlorothiazide (DIOVAN-HCT) 320-25 MG tablet Take 1 tablet by mouth daily.   Yes [provider]      Allergies    Patient has no known allergies.    Review of Systems   Review of Systems  Physical Exam Updated Vital Signs BP (!) 130/90 (BP Location: Right Arm)   Pulse (!) 109   Temp 98.8 F (37.1 C) (Oral)   Resp 16   Ht 6' (1.829 m)   Wt 88.9 kg   SpO2 95%   BMI 26.58 kg/m  Physical Exam  ED Results / Procedures / Treatments   Labs (all labs ordered are listed, but only abnormal results are displayed) Labs Reviewed  URINALYSIS, ROUTINE W REFLEX MICROSCOPIC - Abnormal; Notable for the following components:      Result Value   Hgb urine  dipstick SMALL (*)    Bacteria, UA RARE (*)    All other components within normal limits  URINE CULTURE    EKG None  Radiology DG Abdomen 1 View Result Date: 07/12/2023 CLINICAL DATA:  Groin and bilateral calf pain. Recent radiation treatment for prostate cancer. EXAM: ABDOMEN - 1 VIEW COMPARISON:  Pelvic CT 01/29/2023. Abdominal radiographs 03/14/2023. FINDINGS: 1612 hours. Two portable supine views of the abdomen are submitted. There is a normal nonobstructive bowel gas pattern. The abdomen is relatively gasless. No suspicious abdominal calcifications are identified. Radiodensities overlying the mid right abdomen may correspond with postsurgical clips on previous  CT. There are new prostate brachytherapy seeds. The bones appear unremarkable. IMPRESSION: 1. No acute abdominal findings. The abdomen is relatively gasless. 2. New prostate brachytherapy seeds. Electronically Signed   By: Carey Bullocks M.D.   On: 07/12/2023 16:20    Procedures Procedures  {Document cardiac monitor, telemetry assessment procedure when appropriate:1}  Medications Ordered in ED Medications  HYDROmorphone (DILAUDID) injection 1 mg (1 mg Intramuscular Given 07/12/23 1636)  cefTRIAXone (ROCEPHIN) injection 1 g (1 g Intramuscular Given 07/12/23 1930)  oxyCODONE-acetaminophen (PERCOCET/ROXICET) 5-325 MG per tablet 2 tablet (2 tablets Oral Given 07/12/23 1929)    ED Course/ Medical Decision Making/ A&P   {   Click here for ABCD2, HEART and other calculatorsREFRESH Note before signing :1}                              Medical Decision Making Amount and/or Complexity of Data Reviewed Labs: ordered. Radiology: ordered.  Risk Prescription drug management.   With UTI and pain from radiation treatment.  Patient sent home with Keflex and Percocet and will follow-up with his urologist  {Document critical care time when appropriate:1} {Document review of labs and clinical decision tools ie heart score, Chads2Vasc2 etc:1}  {Document your independent review of radiology images, and any outside records:1} {Document your discussion with family members, caretakers, and with consultants:1} {Document social determinants of health affecting pt's care:1} {Document your decision making why or why not admission, treatments were needed:1} Final Clinical Impression(s) / ED Diagnoses Final diagnoses:  Acute cystitis without hematuria    Rx / DC Orders ED Discharge Orders          Ordered    cephALEXin (KEFLEX) 500 MG capsule  4 times daily        07/12/23 1954    oxyCODONE-acetaminophen (PERCOCET/ROXICET) 5-325 MG tablet  Every 6 hours PRN        07/12/23 1954

## 2023-07-15 LAB — URINE CULTURE: Culture: >=100000 — AB

## 2023-07-16 ENCOUNTER — Telehealth (HOSPITAL_BASED_OUTPATIENT_CLINIC_OR_DEPARTMENT_OTHER): Payer: Self-pay | Admitting: *Deleted

## 2023-07-16 NOTE — Telephone Encounter (Signed)
 Post ED Visit - Positive Culture Follow-up: Successful Patient Follow-Up  Culture assessed and recommendations reviewed by:  []  Enzo Bi, Pharm.D. []  Celedonio Miyamoto, Pharm.D., BCPS AQ-ID []  Garvin Fila, Pharm.D., BCPS []  Georgina Pillion, [address].D., BCPS []  [place], [address].D., BCPS, AAHIVP []  Estella Husk, Pharm.D., BCPS, AAHIVP []  Lysle Pearl, PharmD, BCPS []  Phillips Climes, PharmD, BCPS []  Agapito Games, PharmD, BCPS [x] Ivery Quale, PharmD  Positive urine culture  []  Patient discharged without antimicrobial prescription and treatment is now indicated [x]  Organism is resistant to prescribed ED discharge antimicrobial []  Patient with positive blood cultures  Changes discussed with ED provider: Riki Sheer, PA New antibiotic prescription Macrobid 100mg  BID x 7 days Called to [place], [place]  Contacted patient, date 07/16/23, time 0757   Patsey Berthold 07/16/2023, 7:57 AM

## 2023-07-26 ENCOUNTER — Inpatient Hospital Stay: Payer: Medicare Other | Attending: Adult Health | Admitting: *Deleted

## 2023-07-26 ENCOUNTER — Encounter: Payer: Self-pay | Admitting: *Deleted

## 2023-07-26 VITALS — BP 120/83 | HR 62 | Temp 98.2°F | Resp 18 | Ht 73.0 in | Wt 191.7 lb

## 2023-07-26 DIAGNOSIS — C61 Malignant neoplasm of prostate: Secondary | ICD-10-CM

## 2023-07-26 NOTE — Progress Notes (Signed)
 SCP reviewed and completed. Pt came in-person today. Vitals and weight were taken. All WDL. Last colonoscopy was 11/09/2017. Next due 2029. Pt will get post-tx PSA labs on 09/17/2023. Discussed diet and exercise and shared with him some of the free resources offered to him.

## 2023-08-01 ENCOUNTER — Telehealth: Payer: Self-pay | Admitting: Urology

## 2023-08-01 NOTE — Telephone Encounter (Signed)
 Im assuming that the wife meant 02/13 however not too sure about the length of recovery so FYI and advise

## 2023-08-01 NOTE — Telephone Encounter (Signed)
 Patient needs a Dr note to cover him from Jan 13-March 10. He can pick up today after lunch. Please call to confirm it is ready.

## 2023-08-02 NOTE — Telephone Encounter (Signed)
 Per MD [place] Pt okay to have Dr note to cover dates 01/24-02/13 advised Pt he could pick it up at the front desk see previous telephone encounter for context

## 2023-08-02 NOTE — Telephone Encounter (Signed)
 Going to verify with the Pt the correct dates but just for clarification 01/24-2/13 ? Or 2/13-03/10

## 2023-09-12 ENCOUNTER — Ambulatory Visit (HOSPITAL_COMMUNITY)
Admission: RE | Admit: 2023-09-12 | Discharge: 2023-09-12 | Disposition: A | Discharge status: Home / Self Care | Payer: Medicare Other | Source: Ambulatory Visit | Attending: Urology | Admitting: Urology

## 2023-09-12 DIAGNOSIS — N200 Calculus of kidney: Secondary | ICD-10-CM

## 2023-09-12 DIAGNOSIS — R3129 Other microscopic hematuria: Secondary | ICD-10-CM

## 2023-09-12 DIAGNOSIS — R319 Hematuria, unspecified: Secondary | ICD-10-CM | POA: Diagnosis not present

## 2023-09-12 DIAGNOSIS — R109 Unspecified abdominal pain: Secondary | ICD-10-CM | POA: Diagnosis not present

## 2023-09-17 ENCOUNTER — Other Ambulatory Visit: Payer: Medicare Other

## 2023-09-17 DIAGNOSIS — C61 Malignant neoplasm of prostate: Secondary | ICD-10-CM

## 2023-09-18 LAB — PSA: Prostate Specific Ag, Serum: 0.6 ng/mL (ref 0.0–4.0)

## 2023-09-24 ENCOUNTER — Ambulatory Visit: Payer: Medicare Other | Admitting: Urology

## 2023-09-24 VITALS — BP 110/67 | HR 69

## 2023-09-24 DIAGNOSIS — N138 Other obstructive and reflux uropathy: Secondary | ICD-10-CM | POA: Diagnosis not present

## 2023-09-24 DIAGNOSIS — N401 Enlarged prostate with lower urinary tract symptoms: Secondary | ICD-10-CM | POA: Diagnosis not present

## 2023-09-24 DIAGNOSIS — R3912 Poor urinary stream: Secondary | ICD-10-CM

## 2023-09-24 DIAGNOSIS — C61 Malignant neoplasm of prostate: Secondary | ICD-10-CM

## 2023-09-24 DIAGNOSIS — R3129 Other microscopic hematuria: Secondary | ICD-10-CM | POA: Diagnosis not present

## 2023-09-24 LAB — BLADDER SCAN AMB NON-IMAGING: Scan Result: 188

## 2023-09-24 MED ORDER — TAMSULOSIN HCL 0.4 MG PO CAPS: 0.8000 mg | ORAL_CAPSULE | Freq: Every day | ORAL | 3 refills | Status: AC

## 2023-09-24 NOTE — Progress Notes (Signed)
 Bladder Scan completed today.  Patient can void prior to the bladder scan. Bladder scan result: 188  Performed By: Melvenia Stabs. CMA  Additional notes-

## 2023-09-24 NOTE — Progress Notes (Unsigned)
 09/24/2023 2:46 PM   Leonard Stark 08/06/1950 [id number]  Referring provider: Minus Amel, MD [address] [place],  [place] [address]  No chief complaint on file.   HPI:  F/u -    1) prostate cancer -- He was treated with brachytherapy, [employer] 06/15/2023 for an early Int risk PCa dx Oct 2024 (PSA 7.4, T1c, p 47 g, GG2, GG1). H/o PSA elevation 7.3 by Dr. Glady Laming.  2) urolithiasis - a Sep 2024 CT showed bilateral punctate kidneys and large left distal stones. S/p left ureteroscopy, laser lithotripsy and stent for 2 left distal stones and a left ureterocele October 2024.  He removed the stent with the string.    Today, seen for the above. Had right flank pain in Feb 2025. Feb 2025 KUB clear. No stones. May 2025 CT punctate bilateral stones. ? Nodular thickening of bladder base. His April 2025 PSA 0.6.  Excellent. He had dysuria tx with cephalexin  in Feb 2025. IPSS today 23. PVR 188. UA > 30 rbc. On tams 0.4 and oxybutynin. No GI issues.  Sometimes weak stream and dysuria.   PMH: Past Medical History:  Diagnosis Date   BPH associated with nocturia    CAD (coronary artery disease) 2011   cardiology--- dr s. 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 [place];   stress test suggestive ischemia;   mild non-obstructive CAD w/ normal LVEF;   dLM 10%/  mLAD 30%/  ostial D1 30%/  ostial OM1 20%   COLONOSCOPY  10/13/2011   benign polyps, poor prep compromised exam, next TCS 7 years.    COLONOSCOPY N/A 11/09/2017   Dr. Riley Cheadle: Tortuous colon, melanosis coli.  Repeat colonoscopy in 10 years.   CYSTOSCOPY/URETEROSCOPY/HOLMIUM LASER/STENT PLACEMENT Left 03/13/2023   Procedure: CYSTOSCOPY LEFT RETROGRADE PYELOGRAM LEFT URETEROSCOPY/HOLMIUM LASER/STENT PLACEMENT;  Surgeon: Christina Coyer, MD;  Location: [facility];  Service: Urology;  Laterality: Left;   ESOPHAGOGASTRODUODENOSCOPY N/A 11/09/2017   Dr. Riley Cheadle: Small hiatal hernia, normal esophagus status post dilation for dysphagia.   LAPAROSCOPIC APPENDECTOMY  10/15/2013   @ [facility] in [place] by dr b. 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 coming to 0.3 cm of the closest mesenteric resection margin   MALONEY DILATION N/A 11/09/2017   Procedure: Londa Rival DILATION;  Surgeon: Suzette Espy, MD;  Location: [facility];  Service: Endoscopy;  Laterality: N/A;   PROSTATE BIOPSY N/A 03/13/2023   Procedure:  BIOPSY TRANSRECTAL ULTRASONIC PROSTATE (TUBP);  Surgeon: Christina Coyer, MD;  Location: [facility];  Service: Urology;  Laterality: N/A;  90 MINS FOR CASE   RADIOACTIVE SEED IMPLANT N/A 06/15/2023   Procedure: RADIOACTIVE SEED IMPLANT/BRACHYTHERAPY IMPLANT;  Surgeon: Christina Coyer, MD;  Location: [facility];  Service: Urology;  Laterality: N/A;   SPACE OAR INSTILLATION N/A 06/15/2023   Procedure: SPACE OAR INSTILLATION;  Surgeon: Christina Coyer, MD;  Location: [facility];  Service:  Urology;  Laterality: N/A;    Home Medications:  Allergies as of 09/24/2023   No Known Allergies      Medication List        Accurate as of Sep 24, 2023  2:46 PM. If you have any questions, ask your nurse or doctor.          acetaminophen  500 MG tablet Commonly known as: TYLENOL  Take 1,000 mg by mouth every 6 (six) hours as needed for moderate pain.   aspirin EC 81 MG tablet Take 81 mg by mouth daily. Swallow whole.   atenolol 50 MG tablet Commonly known as: TENORMIN Take 50 mg by mouth every morning.   clonazePAM 0.5 MG tablet Commonly known as: KLONOPIN Take 0.5 mg by mouth 2 (two) times daily.   CoQ10 100 MG Caps Take 100 mg by mouth daily.   Fish Oil 1000 MG Caps Take 1,000 mg by mouth daily.   Linzess  290 MCG Caps capsule Generic drug: linaclotide  Take 290 mcg by mouth daily before breakfast.   meloxicam 15 MG tablet Commonly known as: MOBIC Take 15 mg by mouth daily.   oxybutynin 15 MG 24 hr tablet Commonly known as: DITROPAN XL Take 15 mg by mouth daily.   pantoprazole  40 MG tablet Commonly known as: PROTONIX  TAKE 1 TABLET TWICE A DAY BEFORE MEALS What changed: See the new instructions.   pravastatin 40 MG tablet Commonly known as: PRAVACHOL Take 40 mg by mouth every morning.   tamsulosin  0.4 MG Caps capsule Commonly known as: FLOMAX  Take 1 capsule (0.4 mg total) by mouth daily after supper.   valsartan-hydrochlorothiazide 320-25 MG tablet Commonly known as: DIOVAN-HCT Take 1 tablet by mouth daily.        Allergies: No Known Allergies  Family History: Family History  Problem Relation Age of Onset   Stroke Mother    Hypertension Sister    Diabetes Sister    Colon cancer Neg Hx     Social History:  reports that he has quit smoking. His smoking use included cigarettes. He has never used smokeless tobacco. He reports that he does not drink alcohol and does not use drugs.   Physical Exam: There were no vitals taken for this visit.   Constitutional:  Alert and oriented, No acute distress. HEENT: [place] AT, moist mucus membranes.  Trachea midline, no masses. Cardiovascular: No clubbing, cyanosis, or edema. Respiratory: Normal respiratory effort, no increased work of breathing. GI: Abdomen is soft, nontender, nondistended, no abdominal masses GU: No CVA tenderness Skin: No rashes, bruises or suspicious lesions. Neurologic: Grossly intact, no focal deficits, moving all 4 extremities. Psychiatric: Normal mood and affect.  Laboratory Data: Lab Results  Component Value Date   WBC 5.5 03/06/2023   HGB 14.6 06/15/2023   HCT 43.0 06/15/2023   MCV 86.4 03/06/2023   PLT 170 03/06/2023    Lab Results  Component Value Date   CREATININE 1.30 (H) 06/15/2023    No results found for: "PSA"  No results found for: "TESTOSTERONE"  No results found for: "HGBA1C"  Urinalysis    Component Value Date/Time   COLORURINE YELLOW 07/12/2023 1550   APPEARANCEUR CLEAR 07/12/2023 1550   APPEARANCEUR Clear 06/25/2023 1445   LABSPEC 1.014 07/12/2023 1550   PHURINE 6.0 07/12/2023 1550   GLUCOSEU NEGATIVE 07/12/2023 1550   HGBUR SMALL (A) 07/12/2023 1550   BILIRUBINUR NEGATIVE 07/12/2023 1550   BILIRUBINUR Negative 06/25/2023 1445   KETONESUR NEGATIVE 07/12/2023 1550   PROTEINUR NEGATIVE 07/12/2023 1550   UROBILINOGEN 1.0 12/30/2012 1825   NITRITE NEGATIVE 07/12/2023 1550   LEUKOCYTESUR NEGATIVE 07/12/2023 1550    Lab Results  Component Value Date   LABMICR See below: 06/25/2023   WBCUA 0-5 06/25/2023   LABEPIT 0-10 06/25/2023   BACTERIA RARE (A) 07/12/2023    Pertinent Imaging:  Results for orders placed during the hospital encounter of 07/12/23  DG Abdomen 1 View  Narrative CLINICAL DATA:  Groin and bilateral calf pain. Recent radiation treatment for prostate cancer.  EXAM: ABDOMEN - 1 VIEW  COMPARISON:  Pelvic CT 01/29/2023. Abdominal radiographs 03/14/2023.  FINDINGS: 1612 hours. Two portable supine views  of the abdomen are submitted. There is a normal nonobstructive bowel gas pattern. The abdomen is relatively gasless. No suspicious abdominal calcifications are identified. Radiodensities overlying the mid right abdomen may correspond with postsurgical clips on previous CT. There are new prostate brachytherapy seeds. The bones appear unremarkable.  IMPRESSION: 1. No acute abdominal findings. The abdomen is relatively gasless. 2. New prostate brachytherapy seeds.   Electronically Signed By: Elmon Hagedorn M.D. On: 07/12/2023 16:20    Assessment & Plan:    1) prostate cancer - PSA looks great.   2) kidney stones - monitor.   3) MH, dysuria - send urine for cx. Check cystoscopy.   4) BPH, wk stream - increase tamsulosin  to 0.8.   No follow-ups on file.  Christina Coyer, MD  [facility]  [address] [place], [place] [address] [phone]

## 2023-09-25 LAB — MICROSCOPIC EXAMINATION
RBC, Urine: >30 /HPF — AB (ref 0–2)
WBC, UA: >30 /HPF — AB (ref 0–5)

## 2023-09-25 LAB — URINALYSIS, ROUTINE W REFLEX MICROSCOPIC
Bilirubin, UA: NEGATIVE
Glucose, UA: NEGATIVE
Ketones, UA: NEGATIVE
Nitrite, UA: NEGATIVE
Specific Gravity, UA: 1.015 (ref 1.005–1.030)
Urobilinogen, Ur: 1 mg/dL (ref 0.2–1.0)
pH, UA: 6 (ref 5.0–7.5)

## 2023-09-27 LAB — URINE CULTURE

## 2023-09-28 ENCOUNTER — Telehealth: Payer: Self-pay

## 2023-09-28 DIAGNOSIS — R300 Dysuria: Secondary | ICD-10-CM

## 2023-09-28 MED ORDER — CIPROFLOXACIN HCL 250 MG PO TABS: 250.0000 mg | ORAL_TABLET | Freq: Two times a day (BID) | ORAL | 0 refills | Status: AC

## 2023-09-28 NOTE — Telephone Encounter (Signed)
-----   Message from Christina Coyer sent at 09/27/2023  5:10 PM EDT ----- Send in cipro  250 mg po bid for 5days - #10, no refill. Thank you! ----- Message ----- From: Dyke Glasser, CMA Sent: 09/27/2023   3:47 PM EDT To: Christina Coyer, MD  Please review and advise

## 2023-09-28 NOTE — Telephone Encounter (Signed)
 Called pt to notify him of his positive urine culture per MD Derrick Fling pt confirmed pharmacy (see previous encounter for details)

## 2023-11-19 ENCOUNTER — Ambulatory Visit: Admitting: Urology

## 2023-11-19 ENCOUNTER — Encounter: Payer: Self-pay | Admitting: Urology

## 2023-11-19 VITALS — BP 104/68 | HR 58

## 2023-11-19 DIAGNOSIS — Z8546 Personal history of malignant neoplasm of prostate: Secondary | ICD-10-CM

## 2023-11-19 DIAGNOSIS — R3129 Other microscopic hematuria: Secondary | ICD-10-CM

## 2023-11-19 DIAGNOSIS — C61 Malignant neoplasm of prostate: Secondary | ICD-10-CM

## 2023-11-19 DIAGNOSIS — N400 Enlarged prostate without lower urinary tract symptoms: Secondary | ICD-10-CM

## 2023-11-19 MED ORDER — CIPROFLOXACIN HCL 500 MG PO TABS
500.0000 mg | ORAL_TABLET | Freq: Once | ORAL | Status: AC
Start: 2023-11-19 — End: 2023-11-19
  Administered 2023-11-19: 500 mg via ORAL

## 2023-11-19 NOTE — Progress Notes (Unsigned)
 11/19/2023 2:04 PM   Leonard Stark 1951/04/02 [id number]  Referring provider: Marvine Rush, MD [address] [place],  [place] [address]  No chief complaint on file.   HPI:  F/u -    1) prostate cancer -- He was treated with brachytherapy, [employer] 06/15/2023 for an early Int risk PCa dx Oct 2024 (PSA 7.4, T1c, p 47 g, GG2, GG1). H/o PSA elevation 7.3 by Dr. Marvine.    His April 2025 PSA 0.6.  Excellent.    2) urolithiasis - a Sep 2024 CT showed bilateral punctate kidneys and large left distal stones. S/p left ureteroscopy, laser lithotripsy and stent for 2 left distal stones in a left ureterocele October 2024.  He removed the stent with the string.   3) MH - Had right flank pain in Feb 2025. Feb 2025 KUB clear. No stones. May 2025 CT punctate bilateral stones. ? Nodular thickening of bladder base. He had dysuria tx with cephalexin  in Feb 2025. IPSS 23. PVR 188. UA > 30 rbc. On tams 0.4 and oxybutynin. No GI issues.  Sometimes weak stream and dysuria.   Today, seen for the above. No dysuria or gross hematuria. Cysto benign - short prostate, borderline obs, higher BN.   PMH: Past Medical History:  Diagnosis Date   BPH associated with nocturia    CAD (coronary artery disease) 2011   cardiology--- dr s. 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 orest;   stress test suggestive ischemia;   mild non-obstructive CAD w/ normal LVEF;   dLM 10%/  mLAD 30%/  ostial D1 30%/  ostial OM1 20%   COLONOSCOPY  10/13/2011   benign polyps, poor prep compromised exam, next TCS 7 years.    COLONOSCOPY N/A 11/09/2017   Dr. Shaaron: Tortuous colon, melanosis coli.  Repeat colonoscopy in 10 years.   CYSTOSCOPY/URETEROSCOPY/HOLMIUM LASER/STENT PLACEMENT Left 03/13/2023   Procedure: CYSTOSCOPY LEFT RETROGRADE PYELOGRAM LEFT URETEROSCOPY/HOLMIUM LASER/STENT PLACEMENT;  Surgeon: Nieves Cough, MD;  Location: [facility];  Service: Urology;  Laterality: Left;   ESOPHAGOGASTRODUODENOSCOPY N/A 11/09/2017   Dr. Shaaron: Small hiatal hernia, normal esophagus status post dilation for dysphagia.   LAPAROSCOPIC APPENDECTOMY  10/15/2013   @ [facility] in [place] by dr b. 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 in maximal dimension coming to 0.3 cm of the closest mesenteric resection margin   MALONEY DILATION N/A 11/09/2017   Procedure: AGAPITO DILATION;  Surgeon: Shaaron Charleston  M, MD;  [facility];  Service: Endoscopy;  Laterality: N/A;   PROSTATE BIOPSY N/A 03/13/2023   Procedure: BIOPSY TRANSRECTAL ULTRASONIC PROSTATE (TUBP);  Surgeon: Nieves Cough, MD;  [facility];  Service: Urology;  Laterality: N/A;  90 MINS FOR CASE   RADIOACTIVE SEED IMPLANT N/A 06/15/2023   Procedure: RADIOACTIVE SEED IMPLANT/BRACHYTHERAPY IMPLANT;  Surgeon: Nieves Cough, MD;  Location: [facility];  Service: Urology;  Laterality: N/A;   SPACE OAR INSTILLATION N/A 06/15/2023   Procedure: SPACE OAR  INSTILLATION;  Surgeon: Nieves Cough, MD;  Location: [facility];  Service: Urology;  Laterality: N/A;    Home Medications:  Allergies as of 11/19/2023   No Known Allergies      Medication List        Accurate as of November 19, 2023  2:04 PM. If you have any questions, ask your nurse or doctor.          acetaminophen  500 MG tablet Commonly known as: TYLENOL  Take 1,000 mg by mouth every 6 (six) hours as needed for moderate pain.   aspirin EC 81 MG tablet Take 81 mg by mouth daily. Swallow whole.   atenolol 50 MG tablet Commonly known as: TENORMIN Take 50 mg by mouth every morning.   ciprofloxacin  250 MG tablet Commonly known as: Cipro  Take 1 tablet (250 mg total) by mouth 2 (two) times daily.   clonazePAM 0.5 MG tablet Commonly known as: KLONOPIN Take 0.5 mg by mouth 2 (two) times daily.   CoQ10 100 MG Caps Take 100 mg by mouth daily.   Fish Oil 1000 MG Caps Take 1,000 mg by mouth daily.   Linzess  290 MCG Caps capsule Generic drug: linaclotide  Take 290 mcg by mouth daily before breakfast.   meloxicam 15 MG tablet Commonly known as: MOBIC Take 15 mg by mouth daily.   oxybutynin 15 MG 24 hr tablet Commonly known as: DITROPAN XL Take 15 mg by mouth daily.   pantoprazole  40 MG tablet Commonly known as: PROTONIX  TAKE 1 TABLET TWICE A DAY BEFORE MEALS What changed: See the new instructions.   pravastatin 40 MG tablet Commonly known as: PRAVACHOL Take 40 mg by mouth every morning.   tamsulosin  0.4 MG Caps capsule Commonly known as: FLOMAX  Take 2 capsules (0.8 mg total) by mouth daily after supper.   valsartan-hydrochlorothiazide 320-25 MG tablet Commonly known as: DIOVAN-HCT Take 1 tablet by mouth daily.        Allergies: No Known Allergies  Family History: Family History  Problem Relation Age of Onset   Stroke Mother    Hypertension Sister    Diabetes Sister    Colon cancer Neg Hx     Social History:  reports that he has  quit smoking. His smoking use included cigarettes. He has never used smokeless tobacco. He reports that he does not drink alcohol and does not use drugs.   Physical Exam: Blood pressure 104/68, pulse (!) 58. NED. A&Ox3.   No respiratory distress   Abd soft, NT, ND Normal phallus with bilateral descended testicles  Cystoscopy Procedure Note  Patient identification was confirmed, informed consent was obtained, and patient was prepped using Betadine solution.  Lidocaine  jelly was administered per urethral meatus.     Pre-Procedure: - Inspection reveals a normal caliber ureteral meatus.  Procedure: The flexible cystoscope was introduced without difficulty - No urethral strictures/lesions are present. - short prostate, borderline obs, higher BN.  - Bilateral ureteral orifices identified - Bladder mucosa  reveals no ulcers, tumors, or lesions - No bladder stones - moderate to severe trabeculation  Retroflexion shows normal bladder and bladder neck   Post-Procedure: - Patient tolerated the procedure well    Laboratory Data: Lab Results  Component Value Date   WBC 5.5 03/06/2023   HGB 14.6 06/15/2023   HCT 43.0 06/15/2023   MCV 86.4 03/06/2023   PLT 170 03/06/2023    Lab Results  Component Value Date   CREATININE 1.30 (H) 06/15/2023    No results found for: PSA  No results found for: TESTOSTERONE  No results found for: HGBA1C  Urinalysis    Component Value Date/Time   COLORURINE YELLOW 07/12/2023 1550   APPEARANCEUR Cloudy (A) 09/24/2023 1452   LABSPEC 1.014 07/12/2023 1550   PHURINE 6.0 07/12/2023 1550   GLUCOSEU Negative 09/24/2023 1452   HGBUR SMALL (A) 07/12/2023 1550   BILIRUBINUR Negative 09/24/2023 1452   KETONESUR NEGATIVE 07/12/2023 1550   PROTEINUR Trace 09/24/2023 1452   PROTEINUR NEGATIVE 07/12/2023 1550   UROBILINOGEN 1.0 12/30/2012 1825   NITRITE Negative 09/24/2023 1452   NITRITE NEGATIVE 07/12/2023 1550   LEUKOCYTESUR 3+ (A)  09/24/2023 1452   LEUKOCYTESUR NEGATIVE 07/12/2023 1550    Lab Results  Component Value Date   LABMICR See below: 09/24/2023   WBCUA >30 (A) 09/24/2023   LABEPIT 0-10 09/24/2023   BACTERIA Many (A) 09/24/2023    Pertinent Imaging:  Results for orders placed during the hospital encounter of 07/12/23  DG Abdomen 1 View  Narrative CLINICAL DATA:  Groin and bilateral calf pain. Recent radiation treatment for prostate cancer.  EXAM: ABDOMEN - 1 VIEW  COMPARISON:  Pelvic CT 01/29/2023. Abdominal radiographs 03/14/2023.  FINDINGS: 1612 hours. Two portable supine views of the abdomen are submitted. There is a normal nonobstructive bowel gas pattern. The abdomen is relatively gasless. No suspicious abdominal calcifications are identified. Radiodensities overlying the mid right abdomen may correspond with postsurgical clips on previous CT. There are new prostate brachytherapy seeds. The bones appear unremarkable.  IMPRESSION: 1. No acute abdominal findings. The abdomen is relatively gasless. 2. New prostate brachytherapy seeds.   Electronically Signed By: Elsie Perone M.D. On: 07/12/2023 16:20    Assessment & Plan:    H/o PCa- check PSA in 3 mo  MH - benign eval   BPH - cont tamsulosin    No follow-ups on file.  Donnice Brooks, MD  [facility]  [address] [place], [place] [address] [phone]

## 2023-11-20 LAB — URINALYSIS, ROUTINE W REFLEX MICROSCOPIC
Bilirubin, UA: NEGATIVE
Glucose, UA: NEGATIVE
Ketones, UA: NEGATIVE
Leukocytes,UA: NEGATIVE
Nitrite, UA: NEGATIVE
Protein,UA: NEGATIVE
Specific Gravity, UA: 1.015 (ref 1.005–1.030)
Urobilinogen, Ur: 1 mg/dL (ref 0.2–1.0)
pH, UA: 6 (ref 5.0–7.5)

## 2023-11-20 LAB — MICROSCOPIC EXAMINATION: Bacteria, UA: NONE SEEN

## 2023-12-19 ENCOUNTER — Telehealth: Payer: Self-pay | Admitting: Urology

## 2023-12-19 DIAGNOSIS — R399 Unspecified symptoms and signs involving the genitourinary system: Secondary | ICD-10-CM

## 2023-12-19 NOTE — Telephone Encounter (Signed)
 Dysuria  Patient called with c/o dysuria x 3-5 days days.  Pain: burning  Severity:8/10  Associated Signs and Symptoms:  Fever: noTemp. Chills: no Hematuria: no Urgency: yes Frequency: yes Hesitancy:yes Incontinence: yes Nausea: no Vomiting: no  Message sent to clinic staff to return call to patient to advise of plan.

## 2023-12-19 NOTE — Telephone Encounter (Signed)
 Dysuria  Patient called with c/o dysuria x 3-5 days.  Pain: burning  Severity:8/10  Associated Signs and Symptoms:  Fever: no Chills: no Hematuria: no Urgency: yes Frequency: yes Hesitancy:yes Incontinence: yes Nausea: no Vomiting: no  Urologic History:  Any Recent Urologic Surgeries or Procedures:prostate bx  Recurrent UTI's:no Cystitis: no  Prostatitis:no Kidney or Bladder Stones: a long time ago pt states back has been hurting too all the way across  Plan: Walk-in Clinic: no Appointment w/Physician: [no Lab visit scheduled for urine drop off: Yes Advice given: Come in for ua drop off 1:30PM on 07/31 Do you take on daily medications for UTI suppression No    Pt voiced understanding and agreed to appt time

## 2023-12-20 ENCOUNTER — Other Ambulatory Visit

## 2023-12-20 NOTE — Telephone Encounter (Signed)
 Called pt to give Np response to ua pt voiced his understanding

## 2023-12-21 LAB — URINALYSIS, ROUTINE W REFLEX MICROSCOPIC
Bilirubin, UA: NEGATIVE
Glucose, UA: NEGATIVE
Ketones, UA: NEGATIVE
Leukocytes,UA: NEGATIVE
Nitrite, UA: NEGATIVE
Protein,UA: NEGATIVE
RBC, UA: NEGATIVE
Specific Gravity, UA: <1.005 — ABNORMAL LOW (ref 1.005–1.030)
Urobilinogen, Ur: 0.2 mg/dL (ref 0.2–1.0)
pH, UA: 6 (ref 5.0–7.5)

## 2023-12-22 LAB — URINE CULTURE: Organism ID, Bacteria: NO GROWTH

## 2024-01-03 DIAGNOSIS — D649 Anemia, unspecified: Secondary | ICD-10-CM | POA: Diagnosis not present

## 2024-01-03 DIAGNOSIS — N289 Disorder of kidney and ureter, unspecified: Secondary | ICD-10-CM | POA: Diagnosis not present

## 2024-01-03 DIAGNOSIS — Z0001 Encounter for general adult medical examination with abnormal findings: Secondary | ICD-10-CM | POA: Diagnosis not present

## 2024-01-03 DIAGNOSIS — R7303 Prediabetes: Secondary | ICD-10-CM | POA: Diagnosis not present

## 2024-01-03 DIAGNOSIS — Z029 Encounter for administrative examinations, unspecified: Secondary | ICD-10-CM | POA: Diagnosis not present

## 2024-01-03 DIAGNOSIS — I10 Essential (primary) hypertension: Secondary | ICD-10-CM | POA: Diagnosis not present

## 2024-02-11 ENCOUNTER — Other Ambulatory Visit

## 2024-02-11 DIAGNOSIS — Z8546 Personal history of malignant neoplasm of prostate: Secondary | ICD-10-CM

## 2024-02-12 ENCOUNTER — Ambulatory Visit: Payer: Self-pay

## 2024-02-12 LAB — PSA: Prostate Specific Ag, Serum: 0.2 ng/mL (ref 0.0–4.0)

## 2024-02-18 ENCOUNTER — Encounter: Payer: Self-pay | Admitting: Urology

## 2024-02-18 ENCOUNTER — Ambulatory Visit: Admitting: Urology

## 2024-02-18 VITALS — BP 163/96 | HR 57

## 2024-02-18 DIAGNOSIS — R3912 Poor urinary stream: Secondary | ICD-10-CM | POA: Diagnosis not present

## 2024-02-18 DIAGNOSIS — N138 Other obstructive and reflux uropathy: Secondary | ICD-10-CM | POA: Diagnosis not present

## 2024-02-18 DIAGNOSIS — Z8546 Personal history of malignant neoplasm of prostate: Secondary | ICD-10-CM

## 2024-02-18 DIAGNOSIS — N401 Enlarged prostate with lower urinary tract symptoms: Secondary | ICD-10-CM

## 2024-02-18 LAB — URINALYSIS, ROUTINE W REFLEX MICROSCOPIC
Bilirubin, UA: NEGATIVE
Glucose, UA: NEGATIVE
Ketones, UA: NEGATIVE
Leukocytes,UA: NEGATIVE
Nitrite, UA: NEGATIVE
Protein,UA: NEGATIVE
Specific Gravity, UA: 1.02 (ref 1.005–1.030)
Urobilinogen, Ur: 1 mg/dL (ref 0.2–1.0)
pH, UA: 6 (ref 5.0–7.5)

## 2024-02-18 LAB — MICROSCOPIC EXAMINATION: Bacteria, UA: NONE SEEN

## 2024-02-18 NOTE — Progress Notes (Signed)
 02/18/2024 2:07 PM   Leonard Stark 04/07/1951 [id number]  Referring provider: Marvine Rush, MD [address] [place],  [place] [address]  No chief complaint on file.   HPI:  F/u -    1) prostate cancer -- He was treated with brachytherapy/[employer] JAN 2025 for an early Int risk PCa dx Oct 2024 (PSA 7.4, T1c, p 47 g, GG2, GG1). H/o PSA elevation 7.3 by Dr. Marvine.     His April 2025 PSA 0.6.     2) urolithiasis - a Sep 2024 CT showed bilateral punctate kidneys and large left distal stones. S/p left ureteroscopy, laser lithotripsy and stent for 2 left distal stones in a left ureterocele October 2024.  He removed the stent with the string.    3) MH - Had right flank pain in Feb 2025. Feb 2025 KUB clear. No stones. May 2025 CT punctate bilateral stones. Possible nodular thickening of bladder base. He had dysuria tx with cephalexin  in Feb 2025. IPSS 23. PVR 188. UA > 30 rbc. On tams 0.4 and oxybutynin. No GI issues.  Sometimes weak stream and dysuria. JUN 2025 Cysto benign - short prostate, borderline obs, higher BN.   Today, seen for the above. Sep 2025 PSA 0.2. Looks good.   PMH: Past Medical History:  Diagnosis Date   BPH associated with nocturia    CAD (coronary artery disease) 2011   cardiology--- dr s. 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 orest;   stress test suggestive ischemia;   mild non-obstructive CAD w/ normal LVEF;   dLM 10%/  mLAD 30%/  ostial D1 30%/  ostial OM1 20%   COLONOSCOPY  10/13/2011   benign polyps, poor prep compromised exam, next TCS 7 years.    COLONOSCOPY N/A 11/09/2017   Dr. Shaaron: Tortuous colon, melanosis coli.  Repeat colonoscopy in 10 years.   CYSTOSCOPY/URETEROSCOPY/HOLMIUM LASER/STENT PLACEMENT Left 03/13/2023   Procedure: CYSTOSCOPY LEFT RETROGRADE PYELOGRAM LEFT URETEROSCOPY/HOLMIUM LASER/STENT PLACEMENT;  Surgeon: Nieves Cough, MD;  [facility];  Service: Urology;  Laterality: Left;   ESOPHAGOGASTRODUODENOSCOPY N/A 11/09/2017   Dr. Shaaron: Small hiatal hernia, normal esophagus status post dilation for dysphagia.   LAPAROSCOPIC APPENDECTOMY  10/15/2013   @ [facility] in [place] by dr b. 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 in maximal dimension coming to 0.3 cm of the closest mesenteric resection margin   MALONEY DILATION N/A 11/09/2017   Procedure: MALONEY DILATION;  Surgeon: Shaaron Lamar HERO, MD;  [facility];  Service: Endoscopy;  Laterality: N/A;   PROSTATE BIOPSY N/A 03/13/2023   Procedure: BIOPSY TRANSRECTAL ULTRASONIC PROSTATE (TUBP);  Surgeon: Nieves Cough, MD;  [facility];  Service: Urology;  Laterality: N/A;  90 MINS FOR CASE   RADIOACTIVE SEED IMPLANT N/A 06/15/2023   Procedure: RADIOACTIVE SEED IMPLANT/BRACHYTHERAPY IMPLANT;  Surgeon: Nieves Cough, MD;  Location: [facility];  Service: Urology;  Laterality: N/A;   SPACE OAR INSTILLATION N/A 06/15/2023   Procedure: SPACE OAR  INSTILLATION;  Surgeon: Nieves Cough, MD;  Location: [facility];  Service: Urology;  Laterality: N/A;    Home Medications:  Allergies as of 02/18/2024   No Known Allergies      Medication List        Accurate as of February 18, 2024  2:07 PM. If you have any questions, ask your nurse or doctor.          acetaminophen  500 MG tablet Commonly known as: TYLENOL  Take 1,000 mg by mouth every 6 (six) hours as needed for moderate pain.   aspirin EC 81 MG tablet Take 81 mg by mouth daily. Swallow whole.   atenolol 50 MG tablet Commonly known as: TENORMIN Take 50 mg by mouth every morning.   ciprofloxacin  250 MG tablet Commonly known as: Cipro  Take 1 tablet (250 mg total) by mouth 2 (two) times daily.   clonazePAM 0.5 MG tablet Commonly known as: KLONOPIN Take 0.5 mg by mouth 2 (two) times daily.   CoQ10 100 MG Caps Take 100 mg by mouth daily.   Fish Oil 1000 MG Caps Take 1,000 mg by mouth daily.   Linzess  290 MCG Caps capsule Generic drug: linaclotide  Take 290 mcg by mouth daily before breakfast.   meloxicam 15 MG tablet Commonly known as: MOBIC Take 15 mg by mouth daily.   oxybutynin 15 MG 24 hr tablet Commonly known as: DITROPAN XL Take 15 mg by mouth daily.   pantoprazole  40 MG tablet Commonly known as: PROTONIX  TAKE 1 TABLET TWICE A DAY BEFORE MEALS What changed: See the new instructions.   pravastatin 40 MG tablet Commonly known as: PRAVACHOL Take 40 mg by mouth every morning.   tamsulosin  0.4 MG Caps capsule Commonly known as: FLOMAX  Take 2 capsules (0.8 mg total) by mouth daily after supper.   valsartan-hydrochlorothiazide 320-25 MG tablet Commonly known as: DIOVAN-HCT Take 1 tablet by mouth daily.        Allergies: No Known Allergies  Family History: Family History  Problem Relation Age of Onset   Stroke Mother    Hypertension Sister    Diabetes Sister    Colon cancer Neg Hx     Social History:  reports that  he has quit smoking. His smoking use included cigarettes. He has never used smokeless tobacco. He reports that he does not drink alcohol and does not use drugs.   Physical Exam: There were no vitals taken for this visit.  Constitutional:  Alert and oriented, No acute distress. HEENT: [place] AT, moist mucus membranes.  Trachea midline, no masses. Cardiovascular: No clubbing, cyanosis, or edema. Respiratory: Normal respiratory effort, no increased work of breathing. GI: Abdomen is soft, nontender, nondistended, no abdominal masses GU: No CVA tenderness Skin: No rashes, bruises or suspicious lesions. Neurologic: Grossly intact, no focal deficits, moving all 4 extremities. Psychiatric: Normal mood and affect.  Laboratory Data: Lab Results  Component Value Date   WBC 5.5 03/06/2023  HGB 14.6 06/15/2023   HCT 43.0 06/15/2023   MCV 86.4 03/06/2023   PLT 170 03/06/2023    Lab Results  Component Value Date   CREATININE 1.30 (H) 06/15/2023    No results found for: PSA  No results found for: TESTOSTERONE  No results found for: HGBA1C  Urinalysis    Component Value Date/Time   COLORURINE YELLOW 07/12/2023 1550   APPEARANCEUR Clear 12/20/2023 1317   LABSPEC 1.014 07/12/2023 1550   PHURINE 6.0 07/12/2023 1550   GLUCOSEU Negative 12/20/2023 1317   HGBUR SMALL (A) 07/12/2023 1550   BILIRUBINUR Negative 12/20/2023 1317   KETONESUR NEGATIVE 07/12/2023 1550   PROTEINUR Negative 12/20/2023 1317   PROTEINUR NEGATIVE 07/12/2023 1550   UROBILINOGEN 1.0 12/30/2012 1825   NITRITE Negative 12/20/2023 1317   NITRITE NEGATIVE 07/12/2023 1550   LEUKOCYTESUR Negative 12/20/2023 1317   LEUKOCYTESUR NEGATIVE 07/12/2023 1550    Lab Results  Component Value Date   LABMICR Comment 12/20/2023   WBCUA 0-5 11/19/2023   LABEPIT 0-10 11/19/2023   BACTERIA None seen 11/19/2023    Pertinent Imaging:  Results for orders placed during the hospital encounter of 07/12/23  DG Abdomen 1  View  Narrative CLINICAL DATA:  Groin and bilateral calf pain. Recent radiation treatment for prostate cancer.  EXAM: ABDOMEN - 1 VIEW  COMPARISON:  Pelvic CT 01/29/2023. Abdominal radiographs 03/14/2023.  FINDINGS: 1612 hours. Two portable supine views of the abdomen are submitted. There is a normal nonobstructive bowel gas pattern. The abdomen is relatively gasless. No suspicious abdominal calcifications are identified. Radiodensities overlying the mid right abdomen may correspond with postsurgical clips on previous CT. There are new prostate brachytherapy seeds. The bones appear unremarkable.  IMPRESSION: 1. No acute abdominal findings. The abdomen is relatively gasless. 2. New prostate brachytherapy seeds.   Electronically Signed By: Elsie Perone M.D. On: 07/12/2023 16:20 ts found for this or any previous visit.   Assessment & Plan:    1. BPH with obstruction/lower urinary tract symptoms (Primary)  - Urinalysis, Routine w reflex microscopic  2. H/o PCa - PSA very low. See in 1 year.   No follow-ups on file.  Donnice Brooks, MD  [facility]  [address] [place], [place] [address] [phone]

## 2025-02-23 ENCOUNTER — Ambulatory Visit: Admitting: Urology
